# Patient Record
Sex: Female | Born: 1975 | Race: Black or African American | Hispanic: No | Marital: Single | Smoking: Never smoker
Health system: Southern US, Community
[De-identification: ages and names within clinical notes are randomized; demographics above are authoritative.]

## PROBLEM LIST (undated history)

## (undated) DIAGNOSIS — E119 Type 2 diabetes mellitus without complications: Secondary | ICD-10-CM

## (undated) DIAGNOSIS — I10 Essential (primary) hypertension: Secondary | ICD-10-CM

## (undated) HISTORY — PX: BREAST EXCISIONAL BIOPSY: SUR124

## (undated) HISTORY — PX: CHOLECYSTECTOMY: SHX55

## (undated) HISTORY — PX: TONSILLECTOMY: SUR1361

## (undated) HISTORY — PX: OTHER SURGICAL HISTORY: SHX169

## (undated) HISTORY — PX: TUBAL LIGATION: SHX77

---

## 2014-07-09 ENCOUNTER — Encounter (HOSPITAL_COMMUNITY): Payer: Self-pay | Admitting: Emergency Medicine

## 2014-07-09 ENCOUNTER — Emergency Department (HOSPITAL_COMMUNITY)
Admission: EM | Admit: 2014-07-09 | Discharge: 2014-07-10 | Disposition: A | Discharge status: Home / Self Care | Payer: Self-pay | Attending: Emergency Medicine | Admitting: Emergency Medicine

## 2014-07-09 ENCOUNTER — Emergency Department (HOSPITAL_COMMUNITY): Payer: Self-pay

## 2014-07-09 DIAGNOSIS — S20229A Contusion of unspecified back wall of thorax, initial encounter: Secondary | ICD-10-CM | POA: Insufficient documentation

## 2014-07-09 DIAGNOSIS — I10 Essential (primary) hypertension: Secondary | ICD-10-CM | POA: Insufficient documentation

## 2014-07-09 DIAGNOSIS — Y9289 Other specified places as the place of occurrence of the external cause: Secondary | ICD-10-CM | POA: Insufficient documentation

## 2014-07-09 DIAGNOSIS — W108XXA Fall (on) (from) other stairs and steps, initial encounter: Secondary | ICD-10-CM | POA: Insufficient documentation

## 2014-07-09 DIAGNOSIS — S300XXA Contusion of lower back and pelvis, initial encounter: Secondary | ICD-10-CM

## 2014-07-09 DIAGNOSIS — IMO0002 Reserved for concepts with insufficient information to code with codable children: Secondary | ICD-10-CM | POA: Insufficient documentation

## 2014-07-09 DIAGNOSIS — Y9389 Activity, other specified: Secondary | ICD-10-CM | POA: Insufficient documentation

## 2014-07-09 HISTORY — DX: Essential (primary) hypertension: I10

## 2014-07-09 NOTE — ED Notes (Signed)
Patient transported to X-ray 

## 2014-07-09 NOTE — ED Provider Notes (Signed)
CSN: [id number]     Arrival date & time 07/09/14  2235 History  This chart was scribed for non-physician practitioner working with Pamela SkeensJoshua M Zavitz, MD by Pamela Ewing, ED Scribe. This patient was seen in room TR10C/TR10C and the patient's care was started at 11:03 PM.   Chief Complaint  Patient presents with  . Fall  . Tailbone Pain      The history is provided by the patient. No language interpreter was used.   HPI Comments: Pamela DuelStephanie Ewing is a 38 y.o. female who presents to the Emergency Department complaining of tailbone pain, ongoing for one week. Patient reports going down the stairs one whole leg splint and she fell landing on her tailbone. She denies any other injuries. She states her pain is mainly in the tailbone, states at times it radiates up her back. It is sharp. It is worsened with movement and sitting down. She denies pain radiating down her legs. Patient denies numbness, tingling or weakness in her extremities. She denies any pain around her rectum, no blood in her stool. Denies abdominal pain. Has been taking ibuprofen with no relief. States she feels like pain is worsening. History of chronic back pain.  Past Medical History  Diagnosis Date  . Hypertension    Past Surgical History  Procedure Laterality Date  . Breast lumpectomy    . Cholecystectomy    . Tubal ligation    . Tonsillectomy     History reviewed. No pertinent family history. History  Substance Use Topics  . Smoking status: Never Smoker   . Smokeless tobacco: Not on file  . Alcohol Use: No   OB History   Grav Para Term Preterm Abortions TAB SAB Ect Mult Living                 Review of Systems  Constitutional: Negative for fever and chills.  Gastrointestinal: Negative for vomiting, abdominal pain, diarrhea and blood in stool.  Genitourinary: Negative for enuresis.  Musculoskeletal: Positive for arthralgias and back pain.  Skin: Positive for wound.  Neurological: Negative for weakness and  numbness.      Allergies  Azithromycin and Sulfa antibiotics  Home Medications   Prior to Admission medications   Not on File   Triage Vitals: BP 177/97  Pulse 90  Temp(Src) 98.6 F (37 C) (Oral)  Resp 18  SpO2 100%  LMP 07/02/2014 Physical Exam  Nursing note and vitals reviewed. Constitutional: She is oriented to person, place, and time. She appears well-developed and well-nourished. No distress.  HENT:  Head: Normocephalic and atraumatic.  Eyes: EOM are normal.  Neck: Neck supple.  Cardiovascular: Normal rate.   Pulmonary/Chest: Effort normal. No respiratory distress.  Musculoskeletal: Normal range of motion. She exhibits tenderness.  Tenderness over the tip of the tailbone, lumbar and sacral spine nontender. The tenderness over bilateral SI joints. No tenderness over bilateral pelvis. Full range of motion of bilateral legs at the hip joint. DP pulses intact  Neurological: She is alert and oriented to person, place, and time.  Skin: Skin is warm and dry.  Psychiatric: She has a normal mood and affect. Her behavior is normal.    ED Course  Procedures (including critical care time) COORDINATION OF CARE: 11:02 PM- Discussed treatment plan with patient at bedside and patient agreed to plan.   Labs Review Labs Reviewed - No data to display  Imaging Review Dg Sacrum/coccyx  07/09/2014   CLINICAL DATA:  Fall  EXAM: SACRUM AND COCCYX - 2+  VIEW  COMPARISON:  None.  FINDINGS: There is no evidence of fracture or other focal bone lesions  IMPRESSION: Negative.   Electronically Signed   By: Rise Mu M.D.   On: 07/09/2014 23:41     EKG Interpretation None      MDM   Final diagnoses:  Contusion of coccyx, initial encounter    Patient is here after a fall and landing on her buttock one week ago. Here with persistent coccyx pain. X-rays obtained and is negative. Home with ibuprofen, Norco for severe pain, followup with primary care Dr.  Ceasar Ewing Vitals:    07/09/14 2245  BP: 177/97  Pulse: 90  Temp: 98.6 F (37 C)  TempSrc: Oral  Resp: 18  SpO2: 100%     I personally performed the services described in this documentation, which was scribed in my presence. The recorded information has been reviewed and is accurate.    Lottie Mussel, PA-C 07/10/14 [phone]

## 2014-07-09 NOTE — ED Notes (Signed)
One week post fall landed on tailbone, continues to have pain in back and tailbone since fall denies numbness, tingling and loss of B&B.

## 2014-07-10 MED ORDER — HYDROCODONE-ACETAMINOPHEN 5-325 MG PO TABS: 1.0000 | ORAL_TABLET | Freq: Four times a day (QID) | ORAL | Status: DC | PRN

## 2014-07-10 MED ORDER — HYDROCODONE-ACETAMINOPHEN 5-325 MG PO TABS
1.0000 | ORAL_TABLET | Freq: Once | ORAL | Status: AC
  Administered 2014-07-10: 1 via ORAL
  Filled 2014-07-10: qty 1

## 2014-07-10 MED ORDER — IBUPROFEN 800 MG PO TABS: 800.0000 mg | ORAL_TABLET | Freq: Three times a day (TID) | ORAL | Status: DC

## 2014-07-10 NOTE — Discharge Instructions (Signed)
X-ray today did not show any signs of fractures. Take ibuprofen for pain. Norco for severe pain. Get a donut pillow for sitting. Followup with primary care doctor.   Tailbone Injury The tailbone (coccyx) is the small bone at the lower end of the spine. A tailbone injury may involve stretched ligaments, bruising, or a broken bone (fracture). Women are more vulnerable to this injury due to having a wider pelvis. CAUSES  This type of injury typically occurs from falling and landing on the tailbone. Repeated strain or friction from actions such as rowing and bicycling may also injure the area. The tailbone can be injured during childbirth. Infections or tumors may also press on the tailbone and cause pain. Sometimes, the cause of injury is unknown. SYMPTOMS   Bruising.  Pain when sitting.  Painful bowel movements.  In women, pain during intercourse. DIAGNOSIS  Your caregiver can diagnose a tailbone injury based on your symptoms and a physical exam. X-rays may be taken if a fracture is suspected. Your caregiver may also use an MRI scan imaging test to evaluate your symptoms. TREATMENT  Your caregiver may prescribe medicines to help relieve your pain. Most tailbone injuries heal on their own in 4 to 6 weeks. However, if the injury is caused by an infection or tumor, the recovery period may vary. PREVENTION  Wear appropriate padding and sports gear when bicycling and rowing. This can help prevent an injury from repeated strain or friction. HOME CARE INSTRUCTIONS   Put ice on the injured area.  Put ice in a plastic bag.  Place a towel between your skin and the bag.  Leave the ice on for 15-20 minutes, every hour while awake for the first 1 to 2 days.  Sit on a large, rubber or inflated ring or cushion to ease your pain. Lean forward when sitting to help decrease discomfort.  Avoid sitting for long periods of time.  Increase your activity as the pain allows.  Only take over-the-counter  or prescription medicines for pain, discomfort, or fever as directed by your caregiver.  You may use stool softeners if it is painful to have a bowel movement, or as directed by your caregiver.  Eat a diet with plenty of fiber to help prevent constipation.  Keep all follow-up appointments as directed by your caregiver. SEEK MEDICAL CARE IF:   Your pain becomes worse.  Your bowel movements cause a great deal of discomfort.  You are unable to have a bowel movement.  You have a fever. MAKE SURE YOU:  Understand these instructions.  Will watch your condition.  Will get help right away if you are not doing well or get worse. Document Released: 11/29/2000 Document Revised: 02/24/2012 Document Reviewed: 06/27/2011 [facility] Patient Information 2015 [place], [place]. This information is not intended to replace advice given to you by your health care provider. Make sure you discuss any questions you have with your health care provider.

## 2014-07-10 NOTE — ED Provider Notes (Signed)
Medical screening examination/treatment/procedure(s) were performed by non-physician practitioner and as supervising physician I was immediately available for consultation/collaboration.   EKG Interpretation None        Greysin Medlen M Larson Limones, MD 07/10/14 0812 

## 2014-07-10 NOTE — ED Notes (Signed)
Discharge instructions and prescription given  Voiced understanding 

## 2014-07-18 ENCOUNTER — Encounter (HOSPITAL_COMMUNITY): Payer: Self-pay | Admitting: Emergency Medicine

## 2014-07-18 ENCOUNTER — Emergency Department (HOSPITAL_COMMUNITY)
Admission: EM | Admit: 2014-07-18 | Discharge: 2014-07-18 | Disposition: A | Discharge status: Home / Self Care | Payer: Self-pay | Attending: Emergency Medicine | Admitting: Emergency Medicine

## 2014-07-18 DIAGNOSIS — I10 Essential (primary) hypertension: Secondary | ICD-10-CM | POA: Insufficient documentation

## 2014-07-18 DIAGNOSIS — H9209 Otalgia, unspecified ear: Secondary | ICD-10-CM | POA: Insufficient documentation

## 2014-07-18 DIAGNOSIS — R599 Enlarged lymph nodes, unspecified: Secondary | ICD-10-CM | POA: Insufficient documentation

## 2014-07-18 DIAGNOSIS — Z79899 Other long term (current) drug therapy: Secondary | ICD-10-CM | POA: Insufficient documentation

## 2014-07-18 DIAGNOSIS — R590 Localized enlarged lymph nodes: Secondary | ICD-10-CM

## 2014-07-18 DIAGNOSIS — R209 Unspecified disturbances of skin sensation: Secondary | ICD-10-CM | POA: Insufficient documentation

## 2014-07-18 MED ORDER — AMOXICILLIN 500 MG PO CAPS: 500.0000 mg | ORAL_CAPSULE | Freq: Three times a day (TID) | ORAL | Status: DC

## 2014-07-18 NOTE — ED Notes (Signed)
Pt. Stated, I have this lymph node that is swollen behind my left ear causing my ear to be numb, tingling, and my face feels numb.

## 2014-07-18 NOTE — ED Notes (Signed)
Declined W/C at D/C and was escorted to lobby by RN. 

## 2014-07-18 NOTE — Discharge Instructions (Signed)
Take the prescribed medication as directed. Follow-up with a primary care physician in the area to continue monitoring symptoms/lymphadenopathy.  Resource guide attached to help with this. Return to the ED for new or worsening symptoms.   Emergency Department Resource Guide 1) Find a Doctor and Pay Out of Pocket Although you won't have to find out who is covered by your insurance plan, it is a good idea to ask around and get recommendations. You will then need to call the office and see if the doctor you have chosen will accept you as a new patient and what types of options they offer for patients who are self-pay. Some doctors offer discounts or will set up payment plans for their patients who do not have insurance, but you will need to ask so you aren't surprised when you get to your appointment.  2) Contact Your Local Health Department Not all health departments have doctors that can see patients for sick visits, but many do, so it is worth a call to see if yours does. If you don't know where your local health department is, you can check in your phone book. The CDC also has a tool to help you locate your state's health department, and many state websites also have listings of all of their local health departments.  3) Find a Walk-in Clinic If your illness is not likely to be very severe or complicated, you may want to try a walk in clinic. These are popping up all over the country in pharmacies, drugstores, and shopping centers. They're usually staffed by nurse practitioners or physician assistants that have been trained to treat common illnesses and complaints. They're usually fairly quick and inexpensive. However, if you have serious medical issues or chronic medical problems, these are probably not your best option.  No Primary Care Doctor: - Call Health Connect at  [phone] - they can help you locate a primary care doctor that  accepts your insurance, provides certain services,  etc. - Physician Referral Service- [phone]  Chronic Pain Problems: Organization         Address  Phone   Notes  [facility]  [phone] Patients need to be referred by their primary care doctor.   Medication Assistance: Organization         Address  Phone   Notes  [facility] Medication [facility] [address] [place], [place] [address] [phone] --Must be a resident of [facility] -- Must have NO insurance coverage whatsoever (no Medicaid/ Medicare, etc.) -- The pt. MUST have a primary care doctor that directs their care regularly and follows them in the community   Med[facility]ist  [phone]   [employer]  [phone]    Agencies that provide inexpensive medical care: Organization         Address  Phone   Notes  [facility]  [phone]   [facility]    [phone]   [facility] [address] [place], [place] [address] [phone]   [facility] [address] [place], [place] [phone]   Planned Parenthood    [phone]   [facility]    [phone]   [facility]  [address], [place] Phone:  [phone], Fax:  [phone] Hours of Operation:  9 am - 6 pm, M-F.  Also accepts Medicaid/Medicare and self-pay.  [facility]  [address], [place] Phone: [phone], Fax: [phone]. Hours of Operation:  8:30 am - 5:30 pm, M-F.  Also accepts Medicaid and self-pay.  [facility] [place] [address], [place] Phone: [phone]) [phone]   [facility] [address], [place], [place] [phone], Ext. [phone] Mondays & Thursdays: 7-9 AM.  First 15 patients are seen on a first come, first serve basis.    Medicaid-accepting [facility] Providers:  Organization         Address  Phone   Notes  [facility] [address], [place] [phone]) [phone] Also accepts self-pay patients.  [facility] [address], [place]  [phone]   [facility] [address], [place] [phone]   [facility] [address] [place] [address], [place] [phone]) [phone]   Renaye RakersVeita Bland [address], [place]   [phone] Only accepts [place] Access [place]icaid patients after they have their name applied to their card.   Self-Pay (no insurance) in [facility]:  Organization         Address  Phone   Notes  Sickle Cell Patients, [facility] [address], [place] [phone]) [phone]   [facility] [address], [place] [phone]   [facility] [place]  [address], [place] [phone]) [phone]   [facility]/Dr. Osei-Bonsu  [address] [place] [address], [place] or [phone], [place] [phone]) [phone] Phone number for both [place] and [place] locations is the same.  [facility] [address], [place] [phone]) [phone]   [facility] [address] [place] [address], [place] or [address] [phone](336) [phone]) [phone]   [facility] [address], [place] [phone]) [phone], phone; [phone], fax Sees patients 1st and 3rd Saturday of every month.  Must not qualify for public or private insurance (i.e. Medicaid, Medicare, [place] Health Choice, Veterans' Benefits)  Household income should be no more than 200% of the poverty level The clinic cannot treat you if you are pregnant or think you are pregnant  Sexually transmitted diseases are not treated at the clinic.    Dental Care: Organization         Address  Phone  Notes  [facility] of [facility] [address], [place] [phone] Accepts children up to  age 37 who are enrolled in [place]icaid or [place] Health Choice; pregnant women with a Medicaid card; and children who have applied for Medicaid or [place] Health Choice, but were declined, whose parents can pay a reduced fee at time of service.  [facility] of [facility] [place]  [address], [place] [phone] Accepts children up to age 37 who are enrolled in [place]icaid or [place] Health Choice; pregnant women with a Medicaid card; and children who have applied for Medicaid or [place] Health Choice, but were declined, whose parents can pay a reduced fee at time of service.  [facility]  [address], [place] [phone] Patients are seen by appointment only. Walk-ins are not accepted. [facility] will see patients 37 years of age and older. Monday - Tuesday (8am-5pm) Most Wednesdays (8:30-5pm) $30 per visit, cash  only  [facility]  [address], [place] [phone]) [phone] Patients are seen by appointment only. Walk-ins are not accepted. [facility] will see patients 37 years of age and older. One Wednesday Evening (Monthly: Volunteer Based).  $30 per visit, cash only  [facility]  [phone] for adults; Children under age 494, call Graduate Pediatric Dentistry at [phone]. Children aged 524-14, please call [phone] to request a pediatric application.  Dental services are provided in all areas of dental care including fillings, crowns and bridges, complete and partial dentures, implants, gum treatment, root canals, and extractions. Preventive care is also provided. Treatment is provided to both adults and children. Patients are selected via a lottery and there is often a waiting list.   [facility] [address], [place]  [phone] [email or web address]   [facility] [address], [place], [place] [phone], Ext. [phone] Second and Fourth Thursday of  each month, opens at 6:30 AM; Clinic ends at 9 AM.  Patients are seen on a first-come first-served basis, and a limited number are seen during each clinic.   [facility]  [address], [place], [place] [phone](336) [phone]   Eligibility Requirements You must have lived in [place], [place], or [place] counties for at least the last three months.   You cannot be eligible for state or federal sponsored National Cityhealthcare insurance, including CIGNAVeterans Administration, [place]icaid, or Har[employer] EntertainmentMedicare.   You generally cannot be eligible for healthcare insurance through your employer.    How to apply: Eligibility screenings are held every Tuesday and Wednesday afternoon from 1:00 pm until 4:00 pm. You do not need an appointment for the interview!  [facility] [address], [place], [place] [phone]   [facility]  [phone]   [facility]  [phone]   [facility]  [phone]    Behavioral Health Resources in the Community: Intensive Outpatient Programs Organization         Address  Phone  Notes  [facility] [address], [place], [place] [phone]   [facility] [address], [place], [place] [phone]   [facility] [address], [place], [place]  [phone]   [facility] [address],  [place], [place] [phone] or [phone]   Substance Abuse Resources Organization         Address  Phone  Notes  [facility] Drug Services  [phone]   [facility]  [phone]   [facility]  [phone]   [facility]  [phone]   Residential & Outpatient Substance Abuse Program  [phone]   Psychological Services Organization         Address  Phone  Notes  [facility]  [phone]- [phone]   [facility]  [phone]- [phone]   [facility] [address],  [place] [phone] or [phone]    Mobile Crisis Teams Organization         Address  Phone  Notes  [facility], Mobile Crisis Care Unit  [phone]   [facility]  [address] [place], [place] [phone]   Doristine LocksSharon DeEsch [address] [place] [phone]    Self-Help/Support Groups Organization         Address  Phone  Notes  [facility] [place] - variety of support groups  [place] Call for more information  Narcotics Anonymous (NA), Caring Services [address], [employer] [place]  2 meetings at this location   Special educational needs teacher         Address  Phone  Notes  ASAP Residential Treatment [place],    [place]  [phone]   [facility]  [address], [place] [phone], [place], [place]   [place], [place] [phone] Admissions: 8am-3pm M-F  Incentives Substance [place] [address],    [place], [place] [phone]   [facility] [address] [place], [place], [place]   [facility] [address]  [place], [place]   [facility] Programs - Intensive Outpatient [address], [place], [place]   [facility] ([place].,  [place], [place] [phone] or [phone]   [facility] ([facility]) [address] [place], [place] Accepts Medicaid  [facility] [address],  [place] [phone] Substance Abuse/Addiction Treatment   [facility] Organization         Address  Phone  Notes  [facility]  [phone]   Domenic Schwab, PhD [address] [place], [place]   [phone] or [phone]   [place]sey [place] [address] [place], [place] [phone]     [facility] [address], [place], [place] [phone] Insurance/Medicaid/sponsorship through [facility] and Families [address], [address]                                    [place], [place] [phone] [place] Road[address], [place] [phone]    Dr. Adele Schilder  [phone]   [facility] Dept. 1) [address], [place] 2) [place] 3)  [address], [place] [phone]  [phone]   [place] [phone] or [phone] (After Hours)

## 2014-07-18 NOTE — ED Provider Notes (Signed)
CSN: [id number]     Arrival date & time 07/18/14  1517 History  This chart was scribed for non-physician practitioner, Sharilyn Sites, PA-C, working with Doug Sou, MD by Charline Bills, ED Scribe. This patient was seen in room TR06C/TR06C and the patient's care was started at 5:40 PM.   Chief Complaint  Patient presents with  . Otalgia  . Adenopathy   The history is provided by the patient. No language interpreter was used.   HPI Comments: Pamela Ewing is a 38 y.o. female who presents to the Emergency Department complaining of swollen lymph node behind her L ear with an associated throbbing sensation. Pt has h/o enlarged lymph nodes, states she has been seeing her PCP in [place] for this but they cannot figure out what is causing it.  She denies fever, ear pain, sore throat, difficulty swallowing, hearing loss. No PCP-she just moved here from [place]  Past Medical History  Diagnosis Date  . Hypertension    Past Surgical History  Procedure Laterality Date  . Breast lumpectomy    . Cholecystectomy    . Tubal ligation    . Tonsillectomy     No family history on file. History  Substance Use Topics  . Smoking status: Never Smoker   . Smokeless tobacco: Not on file  . Alcohol Use: No   OB History   Grav Para Term Preterm Abortions TAB SAB Ect Mult Living                 Review of Systems  HENT: Negative for dental problem, ear pain, sore throat and trouble swallowing.   Neurological: Positive for numbness.  All other systems reviewed and are negative.  Allergies  Azithromycin and Sulfa antibiotics  Home Medications   Prior to Admission medications   Medication Sig Start Date End Date Taking? Authorizing Provider  hydrochlorothiazide (HYDRODIURIL) 25 MG tablet Take 25 mg by mouth at bedtime.   Yes Historical Provider, MD  HYDROcodone-acetaminophen (NORCO/VICODIN) 5-325 MG per tablet Take 1 tablet by mouth every 6 (six) hours as needed for moderate pain. 07/10/14  Yes  Tatyana A Kirichenko, PA-C  ibuprofen (ADVIL,MOTRIN) 800 MG tablet Take 800 mg by mouth every 6 (six) hours as needed for moderate pain. 07/10/14  Yes Tatyana A Kirichenko, PA-C   Triage Vitals: BP 165/98  Pulse 98  Temp(Src) 98 F (36.7 C) (Oral)  SpO2 98%  LMP 07/02/2014 Physical Exam  Nursing note and vitals reviewed. Constitutional: She is oriented to person, place, and time. She appears well-developed and well-nourished. No distress.  HENT:  Head: Normocephalic and atraumatic.  Right Ear: Tympanic membrane and ear canal normal.  Left Ear: Tympanic membrane and ear canal normal.  Mouth/Throat: Oropharynx is clear and moist.  Mastoids nontender  Eyes: Conjunctivae and EOM are normal. Pupils are equal, round, and reactive to light.  Neck: Normal range of motion and full passive range of motion without pain. Neck supple. No rigidity.  Cardiovascular: Normal rate, regular rhythm and normal heart sounds.   Pulmonary/Chest: Effort normal and breath sounds normal. No respiratory distress. She has no wheezes.  Musculoskeletal: Normal range of motion.  Lymphadenopathy:       Head (left side): Posterior auricular adenopathy present.  Left posterior auricular lymphadenopathy; locally TTP; no overlying erythema, warmth to touch, or signs of infection  Neurological: She is alert and oriented to person, place, and time.  Skin: Skin is warm and dry. She is not diaphoretic.  Psychiatric: She has a normal mood  and affect.   ED Course  Procedures (including critical care time) DIAGNOSTIC STUDIES: Oxygen Saturation is 98% on RA, normal by my interpretation.    COORDINATION OF CARE: 5:44 PM-Discussed treatment plan which includes antibiotics with pt at bedside and pt agreed to plan.   Labs Review Labs Reviewed - No data to display  Imaging Review No results found.   EKG Interpretation None      MDM   Final diagnoses:  Posterior auricular lymphadenopathy   Left posterior auricular  lymphadenopathy without known cause.  Pt is afebrile and overall non-toxic appearing.  Will start on amoxicillin.  Encouraged close FU with PCP in the area-- resource guide given to help with this.  Discussed plan with patient, he/she acknowledged understanding and agreed with plan of care.  Return precautions given for new or worsening symptoms.  I personally performed the services described in this documentation, which was scribed in my presence. The recorded information has been reviewed and is accurate.  Garlon HatchetLisa M Cola Highfill, PA-C 07/18/14 [phone]

## 2014-07-19 NOTE — ED Provider Notes (Signed)
Medical screening examination/treatment/procedure(s) were performed by non-physician practitioner and as supervising physician I was immediately available for consultation/collaboration.   EKG Interpretation None       Doug SouSam Ameyah Bangura, MD 07/19/14 [phone]

## 2014-11-29 ENCOUNTER — Encounter (HOSPITAL_COMMUNITY): Payer: Self-pay | Admitting: Physical Medicine and Rehabilitation

## 2014-11-29 ENCOUNTER — Emergency Department (HOSPITAL_COMMUNITY)
Admission: EM | Admit: 2014-11-29 | Discharge: 2014-11-29 | Disposition: A | Discharge status: Home / Self Care | Payer: 59 | Attending: Emergency Medicine | Admitting: Emergency Medicine

## 2014-11-29 ENCOUNTER — Emergency Department (HOSPITAL_COMMUNITY): Payer: 59

## 2014-11-29 DIAGNOSIS — Z792 Long term (current) use of antibiotics: Secondary | ICD-10-CM | POA: Diagnosis not present

## 2014-11-29 DIAGNOSIS — Z3202 Encounter for pregnancy test, result negative: Secondary | ICD-10-CM | POA: Insufficient documentation

## 2014-11-29 DIAGNOSIS — G44219 Episodic tension-type headache, not intractable: Secondary | ICD-10-CM | POA: Insufficient documentation

## 2014-11-29 DIAGNOSIS — G44209 Tension-type headache, unspecified, not intractable: Secondary | ICD-10-CM

## 2014-11-29 DIAGNOSIS — M7989 Other specified soft tissue disorders: Secondary | ICD-10-CM

## 2014-11-29 DIAGNOSIS — I10 Essential (primary) hypertension: Secondary | ICD-10-CM | POA: Insufficient documentation

## 2014-11-29 LAB — CBC WITH DIFFERENTIAL/PLATELET
Basophils Absolute: 0 10*3/uL (ref 0.0–0.1)
Basophils Relative: 0 % (ref 0–1)
EOS ABS: 0.1 10*3/uL (ref 0.0–0.7)
Eosinophils Relative: 1 % (ref 0–5)
HCT: 35.2 % — ABNORMAL LOW (ref 36.0–46.0)
Hemoglobin: 11.4 g/dL — ABNORMAL LOW (ref 12.0–15.0)
Lymphocytes Relative: 39 % (ref 12–46)
Lymphs Abs: 3.3 10*3/uL (ref 0.7–4.0)
MCH: 26.4 pg (ref 26.0–34.0)
MCHC: 32.4 g/dL (ref 30.0–36.0)
MCV: 81.5 fL (ref 78.0–100.0)
Monocytes Absolute: 0.6 10*3/uL (ref 0.1–1.0)
Monocytes Relative: 7 % (ref 3–12)
Neutro Abs: 4.4 10*3/uL (ref 1.7–7.7)
Neutrophils Relative %: 53 % (ref 43–77)
PLATELETS: 318 10*3/uL (ref 150–400)
RBC: 4.32 MIL/uL (ref 3.87–5.11)
RDW: 15.9 % — ABNORMAL HIGH (ref 11.5–15.5)
WBC: 8.5 10*3/uL (ref 4.0–10.5)

## 2014-11-29 LAB — BASIC METABOLIC PANEL
Anion gap: 12 (ref 5–15)
BUN: 16 mg/dL (ref 6–23)
CHLORIDE: 102 meq/L (ref 96–112)
CO2: 22 mEq/L (ref 19–32)
Calcium: 9.2 mg/dL (ref 8.4–10.5)
Creatinine, Ser: 0.8 mg/dL (ref 0.50–1.10)
GLUCOSE: 84 mg/dL (ref 70–99)
POTASSIUM: 4.5 meq/L (ref 3.7–5.3)
SODIUM: 136 meq/L — AB (ref 137–147)

## 2014-11-29 LAB — POC URINE PREG, ED: PREG TEST UR: NEGATIVE

## 2014-11-29 MED ORDER — ONDANSETRON HCL 4 MG/2ML IJ SOLN
4.0000 mg | Freq: Once | INTRAMUSCULAR | Status: AC
  Administered 2014-11-29: 4 mg via INTRAVENOUS
  Filled 2014-11-29: qty 2

## 2014-11-29 MED ORDER — KETOROLAC TROMETHAMINE 30 MG/ML IJ SOLN
30.0000 mg | Freq: Once | INTRAMUSCULAR | Status: AC
  Administered 2014-11-29: 30 mg via INTRAVENOUS
  Filled 2014-11-29: qty 1

## 2014-11-29 MED ORDER — FUROSEMIDE 20 MG PO TABS
20.0000 mg | ORAL_TABLET | Freq: Once | ORAL | Status: AC
  Administered 2014-11-29: 20 mg via ORAL

## 2014-11-29 MED ORDER — DIPHENHYDRAMINE HCL 50 MG/ML IJ SOLN
25.0000 mg | Freq: Once | INTRAMUSCULAR | Status: AC
  Administered 2014-11-29: 25 mg via INTRAVENOUS
  Filled 2014-11-29: qty 1

## 2014-11-29 MED ORDER — SODIUM CHLORIDE 0.9 % IV BOLUS (SEPSIS)
1000.0000 mL | Freq: Once | INTRAVENOUS | Status: AC
  Administered 2014-11-29: 1000 mL via INTRAVENOUS

## 2014-11-29 MED ORDER — HYDROCHLOROTHIAZIDE 25 MG PO TABS: 25.0000 mg | ORAL_TABLET | Freq: Every day | ORAL | Status: DC

## 2014-11-29 MED ORDER — HYDROCHLOROTHIAZIDE 25 MG PO TABS
25.0000 mg | ORAL_TABLET | Freq: Every day | ORAL | Status: DC
  Administered 2014-11-29: 25 mg via ORAL

## 2014-11-29 MED ORDER — HYDROCODONE-ACETAMINOPHEN 5-325 MG PO TABS: 1.0000 | ORAL_TABLET | Freq: Four times a day (QID) | ORAL | Status: DC | PRN

## 2014-11-29 MED ORDER — HYDROCHLOROTHIAZIDE 25 MG PO TABS
25.0000 mg | ORAL_TABLET | Freq: Every day | ORAL | Status: DC
  Filled 2014-11-29: qty 1

## 2014-11-29 MED ORDER — FUROSEMIDE 20 MG PO TABS
10.0000 mg | ORAL_TABLET | Freq: Once | ORAL | Status: DC
  Filled 2014-11-29: qty 1

## 2014-11-29 NOTE — ED Notes (Signed)
Pt presents to department for evaluation of hypertension and headache. States she ran out of HTN medications x2 weeks ago. 8/10 headache upon arrival. No signs of distress noted. Pt is alert and oriented x4.

## 2014-11-29 NOTE — ED Notes (Signed)
Family at bedside. 

## 2014-11-29 NOTE — ED Notes (Signed)
Pt has "restricted extremity" bracelet on left arm.

## 2014-11-29 NOTE — Discharge Instructions (Signed)
Hypertension °Hypertension, commonly called high blood pressure, is when the force of blood pumping through your arteries is too strong. Your arteries are the blood vessels that carry blood from your heart throughout your body. A blood pressure reading consists of a higher number over a lower number, such as 110/72. The higher number (systolic) is the pressure inside your arteries when your heart pumps. The lower number (diastolic) is the pressure inside your arteries when your heart relaxes. Ideally you want your blood pressure below 120/80. °Hypertension forces your heart to work harder to pump blood. Your arteries may become narrow or stiff. Having hypertension puts you at risk for heart disease, stroke, and other problems.  °RISK FACTORS °Some risk factors for high blood pressure are controllable. Others are not.  °Risk factors you cannot control include:  °· Race. You may be at higher risk if you are African American. °· Age. Risk increases with age. °· Gender. Men are at higher risk than women before age 45 years. After age 65, women are at higher risk than men. °Risk factors you can control include: °· Not getting enough exercise or physical activity. °· Being overweight. °· Getting too much fat, sugar, calories, or salt in your diet. °· Drinking too much alcohol. °SIGNS AND SYMPTOMS °Hypertension does not usually cause signs or symptoms. Extremely high blood pressure (hypertensive crisis) may cause headache, anxiety, shortness of breath, and nosebleed. °DIAGNOSIS  °To check if you have hypertension, your health care provider will measure your blood pressure while you are seated, with your arm held at the level of your heart. It should be measured at least twice using the same arm. Certain conditions can cause a difference in blood pressure between your right and left arms. A blood pressure reading that is higher than normal on one occasion does not mean that you need treatment. If one blood pressure reading  is high, ask your health care provider about having it checked again. °TREATMENT  °Treating high blood pressure includes making lifestyle changes and possibly taking medicine. Living a healthy lifestyle can help lower high blood pressure. You may need to change some of your habits. °Lifestyle changes may include: °· Following the DASH diet. This diet is high in fruits, vegetables, and whole grains. It is low in salt, red meat, and added sugars. °· Getting at least 2½ hours of brisk physical activity every week. °· Losing weight if necessary. °· Not smoking. °· Limiting alcoholic beverages. °· Learning ways to reduce stress. ° If lifestyle changes are not enough to get your blood pressure under control, your health care provider may prescribe medicine. You may need to take more than one. Work closely with your health care provider to understand the risks and benefits. °HOME CARE INSTRUCTIONS °· Have your blood pressure rechecked as directed by your health care provider.   °· Take medicines only as directed by your health care provider. Follow the directions carefully. Blood pressure medicines must be taken as prescribed. The medicine does not work as well when you skip doses. Skipping doses also puts you at risk for problems.   °· Do not smoke.   °· Monitor your blood pressure at home as directed by your health care provider.  °SEEK MEDICAL CARE IF:  °· You think you are having a reaction to medicines taken. °· You have recurrent headaches or feel dizzy. °· You have swelling in your ankles. °· You have trouble with your vision. °SEEK IMMEDIATE MEDICAL CARE IF: °· You develop a severe headache or confusion. °·   You have unusual weakness, numbness, or feel faint.  You have severe chest or abdominal pain.  You vomit repeatedly.  You have trouble breathing. MAKE SURE YOU:   Understand these instructions.  Will watch your condition.  Will get help right away if you are not doing well or get worse. Document  Released: 12/02/2005 Document Revised: 04/18/2014 Document Reviewed: 09/24/2013 [facility] Patient Information 2015 [place], [place]. This information is not intended to replace advice given to you by your health care provider. Make sure you discuss any questions you have with your health care provider.  Peripheral Edema You have swelling in your legs (peripheral edema). This swelling is due to excess accumulation of salt and water in your body. Edema may be a sign of heart, kidney or liver disease, or a side effect of a medication. It may also be due to problems in the leg veins. Elevating your legs and using special support stockings may be very helpful, if the cause of the swelling is due to poor venous circulation. Avoid long periods of standing, whatever the cause. Treatment of edema depends on identifying the cause. Chips, pretzels, pickles and other salty foods should be avoided. Restricting salt in your diet is almost always needed. Water pills (diuretics) are often used to remove the excess salt and water from your body via urine. These medicines prevent the kidney from reabsorbing sodium. This increases urine flow. Diuretic treatment may also result in lowering of potassium levels in your body. Potassium supplements may be needed if you have to use diuretics daily. Daily weights can help you keep track of your progress in clearing your edema. You should call your caregiver for follow up care as recommended. SEEK IMMEDIATE MEDICAL CARE IF:   You have increased swelling, pain, redness, or heat in your legs.  You develop shortness of breath, especially when lying down.  You develop chest or abdominal pain, weakness, or fainting.  You have a fever. Document Released: 01/09/2005 Document Revised: 02/24/2012 Document Reviewed: 12/20/2009 [facility] Patient Information 2015 [place], [place]. This information is not intended to replace advice given to you by your health care provider. Make sure you  discuss any questions you have with your health care provider.  General Headache Without Cause A general headache is pain or discomfort felt around the head or neck area. The cause may not be found.  HOME CARE   Keep all doctor visits.  Only take medicines as told by your doctor.  Lie down in a dark, quiet room when you have a headache.  Keep a journal to find out if certain things bring on headaches. For example, write down:  What you eat and drink.  How much sleep you get.  Any change to your diet or medicines.  Relax by getting a massage or doing other relaxing activities.  Put ice or heat packs on the head and neck area as told by your doctor.  Lessen stress.  Sit up straight. Do not tighten (tense) your muscles.  Quit smoking if you smoke.  Lessen how much alcohol you drink.  Lessen how much caffeine you drink, or stop drinking caffeine.  Eat and sleep on a regular schedule.  Get 7 to 9 hours of sleep, or as told by your doctor.  Keep lights dim if bright lights bother you or make your headaches worse. GET HELP RIGHT AWAY IF:   Your headache becomes really bad.  You have a fever.  You have a stiff neck.  You have trouble  seeing.  Your muscles are weak, or you lose muscle control.  You lose your balance or have trouble walking.  You feel like you will pass out (faint), or you pass out.  You have really bad symptoms that are different than your first symptoms.  You have problems with the medicines given to you by your doctor.  Your medicines do not work.  Your headache feels different than the other headaches.  You feel sick to your stomach (nauseous) or throw up (vomit). MAKE SURE YOU:   Understand these instructions.  Will watch your condition.  Will get help right away if you are not doing well or get worse. Document Released: 09/10/2008 Document Revised: 02/24/2012 Document Reviewed: 11/22/2011 [facility] Patient Information 2015  [place], [place]. This information is not intended to replace advice given to you by your health care provider. Make sure you discuss any questions you have with your health care provider.  Results for orders placed or performed during the hospital encounter of 11/29/14  CBC with Differential  Result Value Ref Range   WBC 8.5 4.0 - 10.5 K/uL   RBC 4.32 3.87 - 5.11 MIL/uL   Hemoglobin 11.4 (L) 12.0 - 15.0 g/dL   HCT 16.135.2 (L) 09.636.0 - 04.546.0 %   MCV 81.5 78.0 - 100.0 fL   MCH 26.4 26.0 - 34.0 pg   MCHC 32.4 30.0 - 36.0 g/dL   RDW 40.915.9 (H) 81.111.5 - 91.415.5 %   Platelets 318 150 - 400 K/uL   Neutrophils Relative % 53 43 - 77 %   Neutro Abs 4.4 1.7 - 7.7 K/uL   Lymphocytes Relative 39 12 - 46 %   Lymphs Abs 3.3 0.7 - 4.0 K/uL   Monocytes Relative 7 3 - 12 %   Monocytes Absolute 0.6 0.1 - 1.0 K/uL   Eosinophils Relative 1 0 - 5 %   Eosinophils Absolute 0.1 0.0 - 0.7 K/uL   Basophils Relative 0 0 - 1 %   Basophils Absolute 0.0 0.0 - 0.1 K/uL  Basic metabolic panel  Result Value Ref Range   Sodium 136 (L) 137 - 147 mEq/L   Potassium 4.5 3.7 - 5.3 mEq/L   Chloride 102 96 - 112 mEq/L   CO2 22 19 - 32 mEq/L   Glucose, Bld 84 70 - 99 mg/dL   BUN 16 6 - 23 mg/dL   Creatinine, Ser 7.820.80 0.50 - 1.10 mg/dL   Calcium 9.2 8.4 - 95.610.5 mg/dL   GFR calc non Af Amer >90 >90 mL/min   GFR calc Af Amer >90 >90 mL/min   Anion gap 12 5 - 15  P[facility] urine preg[facility])  Result Value Ref Range   Preg Test, Ur NEGATIVE NEGATIVE   Dg Chest 2 View  11/29/2014   CLINICAL DATA:  Headache, hypertension and dizziness beginning last night.  EXAM: CHEST  2 VIEW  COMPARISON:  None.  FINDINGS: The lungs are clear. Heart size is normal. There is no pneumothorax or pleural effusion. No focal bony abnormality is identified.  IMPRESSION: Negative chest.   Electronically Signed   By: Drusilla Kannerhomas  Dalessio M.D.   On: 11/29/2014 13:11

## 2014-11-29 NOTE — ED Notes (Signed)
Pt allowed to eat per Pa

## 2014-11-29 NOTE — ED Provider Notes (Signed)
CSN: [id number]     Arrival date & time 11/29/14  1033 History   First MD Initiated Contact with Patient 11/29/14 1104     Chief Complaint  Patient presents with  . Hypertension  . Headache     (Consider location/radiation/quality/duration/timing/severity/associated sxs/prior Treatment) HPI   Patient has newly relocated to the area, she works for [employer] and has PMH of hypertension and headaches. She reports taking hydrochlorothiazide for her fluid accumulation and blood pressure. She has been out of this medication for two weeks because she doesn't have a PCP in the area yet. [facility] and [facility] do not have open appointments until January. She is now having lower extremity swelling and frontal headaches that are pressure and do not radiate. She reports her BP has staying around 150/100. This is high for her and she is concerned it may be the cause of her headaches.   Past Medical History  Diagnosis Date  . Hypertension    Past Surgical History  Procedure Laterality Date  . Breast lumpectomy    . Cholecystectomy    . Tubal ligation    . Tonsillectomy     No family history on file. History  Substance Use Topics  . Smoking status: Never Smoker   . Smokeless tobacco: Not on file  . Alcohol Use: No   OB History    No data available     Review of Systems  10 Systems reviewed and are negative for acute change except as noted in the HPI.   Allergies  Azithromycin; Lisinopril; and Sulfa antibiotics  Home Medications   Prior to Admission medications   Medication Sig Start Date End Date Taking? Authorizing Provider  amoxicillin (AMOXIL) 500 MG capsule Take 1 capsule (500 mg total) by mouth 3 (three) times daily. 07/18/14  Yes Garlon HatchetLisa M Sanders, PA-C  ibuprofen (ADVIL,MOTRIN) 800 MG tablet Take 800 mg by mouth every 6 (six) hours as needed for moderate pain. 07/10/14  Yes Tatyana A Kirichenko, PA-C  hydrochlorothiazide (HYDRODIURIL) 25  MG tablet Take 1 tablet (25 mg total) by mouth at bedtime. 11/29/14   Dorthula Matasiffany G Ranay Ketter, PA-C  HYDROcodone-acetaminophen (NORCO/VICODIN) 5-325 MG per tablet Take 1-2 tablets by mouth every 6 (six) hours as needed for moderate pain. 11/29/14   Bowen Kia Irine SealG Raygan Skarda, PA-C   BP 143/88 mmHg  Pulse 76  Temp(Src) 97.9 F (36.6 C) (Oral)  Resp 18  Ht 5\' 4"  (1.626 m)  Wt 248 lb (112.492 kg)  BMI 42.55 kg/m2  SpO2 100% Physical Exam  Constitutional: She appears well-developed and well-nourished. No distress.  HENT:  Head: Normocephalic and atraumatic.  Eyes: Pupils are equal, round, and reactive to light.  Neck: Normal range of motion. Neck supple.  Cardiovascular: Normal rate and regular rhythm.   Pulmonary/Chest: Effort normal.  Abdominal: Soft.  Musculoskeletal:  Bilateral lower extremity swelling 1+ pitting edema  Neurological: She is alert.  Cranial nerves II-VIII and X-XII evaluated and show no deficits. Pt alert and oriented x 3 Upper and lower extremity strength is symmetrical and physiologic Normal muscular tone No facial droop Coordination intact, no limb ataxia, finger-nose-finger normal Rapid alternating movements normal No pronator drift  Skin: Skin is warm and dry.  Nursing note and vitals reviewed.   ED Course  Procedures (including critical care time) Labs Review Labs Reviewed  CBC WITH DIFFERENTIAL - Abnormal; Notable for the following:    Hemoglobin 11.4 (*)    HCT 35.2 (*)  RDW 15.9 (*)    All other components within normal limits  BASIC METABOLIC PANEL - Abnormal; Notable for the following:    Sodium 136 (*)    All other components within normal limits  POC URINE PREG, ED    Imaging Review Dg Chest 2 View  11/29/2014   CLINICAL DATA:  Headache, hypertension and dizziness beginning last night.  EXAM: CHEST  2 VIEW  COMPARISON:  None.  FINDINGS: The lungs are clear. Heart size is normal. There is no pneumothorax or pleural effusion. No focal bony abnormality  is identified.  IMPRESSION: Negative chest.   Electronically Signed   By: Drusilla Kannerhomas  Dalessio M.D.   On: 11/29/2014 13:11     EKG Interpretation None      MDM   Final diagnoses:  Swelling of lower extremity  Tension-type headache, not intractable, unspecified chronicity pattern  Essential hypertension    Medications  hydrochlorothiazide (HYDRODIURIL) tablet 25 mg (25 mg Oral Given 11/29/14 1215)  furosemide (LASIX) tablet 10 mg (not administered)  sodium chloride 0.9 % bolus 1,000 mL (0 mLs Intravenous Stopped 11/29/14 1341)  ondansetron (ZOFRAN) injection 4 mg (4 mg Intravenous Given 11/29/14 1213)  diphenhydrAMINE (BENADRYL) injection 25 mg (25 mg Intravenous Given 11/29/14 1213)  ketorolac (TORADOL) 30 MG/ML injection 30 mg (30 mg Intravenous Given 11/29/14 1334)    The patient does have a headache with hypertension of 150/100. Her blood pressure is not likely the etiology of her headache which sounds like a tension migraine . She is supposed to be taking hydrochlorothiazidebut ran out 2 weeks ago. The patient denies any symptoms of neurological impairment or TIA's; no amaurosis, diplopia, dysphasia, or unilateral disturbance of motor or sensory function. No loss of balance or vertigo.   Patient's pain completely resolved with IV fluids and medications. She was given her home dose of chlorothiazide and urinated a few times in the ED bathroom. She still has a small amount of large tumor swelling. All give a one-time small dose of 10 mg by mouth here in the ED. Given prescription for pain medicines as well as a hard core thiazide she is to follow-up with PCP return to the ED if symptoms get worse.  38 y.o.Pamela Ewing's evaluation in the Emergency Department is complete. It has been determined that no acute conditions requiring further emergency intervention are present at this time. The patient/guardian have been advised of the diagnosis and plan. We have discussed signs and  symptoms that warrant return to the ED, such as changes or worsening in symptoms.  Vital signs are stable at discharge. Filed Vitals:   11/29/14 1330  BP: 143/88  Pulse: 76  Temp:   Resp: 18    Patient/guardian has voiced understanding and agreed to follow-up with the PCP or specialist.   Dorthula Matasiffany G Rhianna Raulerson, PA-C 11/29/14 1421  Mirian MoMatthew Gentry, MD 11/29/14 [phone]

## 2015-02-25 ENCOUNTER — Emergency Department (HOSPITAL_COMMUNITY)
Admission: EM | Admit: 2015-02-25 | Discharge: 2015-02-25 | Discharge status: Against Medical Advice | Payer: Self-pay | Attending: Emergency Medicine | Admitting: Emergency Medicine

## 2015-02-25 ENCOUNTER — Emergency Department (HOSPITAL_COMMUNITY): Payer: Self-pay

## 2015-02-25 ENCOUNTER — Encounter (HOSPITAL_COMMUNITY): Payer: Self-pay | Admitting: Emergency Medicine

## 2015-02-25 DIAGNOSIS — R0981 Nasal congestion: Secondary | ICD-10-CM | POA: Insufficient documentation

## 2015-02-25 DIAGNOSIS — I10 Essential (primary) hypertension: Secondary | ICD-10-CM | POA: Insufficient documentation

## 2015-02-25 DIAGNOSIS — R05 Cough: Secondary | ICD-10-CM | POA: Insufficient documentation

## 2015-02-25 DIAGNOSIS — R0602 Shortness of breath: Secondary | ICD-10-CM | POA: Insufficient documentation

## 2015-02-25 LAB — CBC WITH DIFFERENTIAL/PLATELET
Basophils Absolute: 0 K/uL (ref 0.0–0.1)
Basophils Relative: 1 % (ref 0–1)
Eosinophils Absolute: 0.1 K/uL (ref 0.0–0.7)
Eosinophils Relative: 2 % (ref 0–5)
HCT: 35.8 % — ABNORMAL LOW (ref 36.0–46.0)
Hemoglobin: 11.5 g/dL — ABNORMAL LOW (ref 12.0–15.0)
Lymphocytes Relative: 23 % (ref 12–46)
Lymphs Abs: 1.4 K/uL (ref 0.7–4.0)
MCH: 26.6 pg (ref 26.0–34.0)
MCHC: 32.1 g/dL (ref 30.0–36.0)
MCV: 82.9 fL (ref 78.0–100.0)
Monocytes Absolute: 0.8 K/uL (ref 0.1–1.0)
Monocytes Relative: 13 % — ABNORMAL HIGH (ref 3–12)
Neutro Abs: 3.8 K/uL (ref 1.7–7.7)
Neutrophils Relative %: 61 % (ref 43–77)
Platelets: 314 K/uL (ref 150–400)
RBC: 4.32 MIL/uL (ref 3.87–5.11)
RDW: 16.4 % — ABNORMAL HIGH (ref 11.5–15.5)
WBC: 6.2 K/uL (ref 4.0–10.5)

## 2015-02-25 LAB — BASIC METABOLIC PANEL
Anion gap: 7 (ref 5–15)
BUN: 16 mg/dL (ref 6–23)
CHLORIDE: 105 mmol/L (ref 96–112)
CO2: 25 mmol/L (ref 19–32)
Calcium: 8.6 mg/dL (ref 8.4–10.5)
Creatinine, Ser: 1.04 mg/dL (ref 0.50–1.10)
GFR calc non Af Amer: 68 mL/min — ABNORMAL LOW (ref >90)
GFR, EST AFRICAN AMERICAN: 79 mL/min — AB (ref >90)
Glucose, Bld: 111 mg/dL — ABNORMAL HIGH (ref 70–99)
Potassium: 4.6 mmol/L (ref 3.5–5.1)
Sodium: 137 mmol/L (ref 135–145)

## 2015-02-25 NOTE — ED Notes (Signed)
  Pt c/o about the wait she does not feel well enough to wait. Encouraged to stay but the pt leaft

## 2015-02-25 NOTE — ED Notes (Signed)
Patient with congestion, cough and sore throat and some shortness of breath with exertion.  Patient states that this has been going on since Thursday.  Patient denies any chest pain.

## 2015-03-30 ENCOUNTER — Emergency Department (HOSPITAL_COMMUNITY)
Admission: EM | Admit: 2015-03-30 | Discharge: 2015-03-30 | Disposition: A | Discharge status: Home / Self Care | Payer: 59 | Source: Home / Self Care | Attending: Family Medicine | Admitting: Family Medicine

## 2015-03-30 ENCOUNTER — Encounter (HOSPITAL_COMMUNITY): Payer: Self-pay | Admitting: Emergency Medicine

## 2015-03-30 DIAGNOSIS — R609 Edema, unspecified: Secondary | ICD-10-CM | POA: Diagnosis not present

## 2015-03-30 DIAGNOSIS — I10 Essential (primary) hypertension: Secondary | ICD-10-CM | POA: Diagnosis not present

## 2015-03-30 DIAGNOSIS — M7918 Myalgia, other site: Secondary | ICD-10-CM

## 2015-03-30 DIAGNOSIS — M791 Myalgia: Secondary | ICD-10-CM | POA: Diagnosis not present

## 2015-03-30 MED ORDER — DICLOFENAC SODIUM 75 MG PO TBEC: 75.0000 mg | DELAYED_RELEASE_TABLET | Freq: Two times a day (BID) | ORAL | Status: AC

## 2015-03-30 MED ORDER — HYDROCHLOROTHIAZIDE 25 MG PO TABS: 25.0000 mg | ORAL_TABLET | Freq: Every day | ORAL | Status: DC

## 2015-03-30 MED ORDER — TRAMADOL HCL 50 MG PO TABS: 50.0000 mg | ORAL_TABLET | Freq: Four times a day (QID) | ORAL | Status: AC | PRN

## 2015-03-30 NOTE — Discharge Instructions (Signed)
Please restart her hydrochlorothiazide for her blood pressure. This medicine will help pull some of the extra fluid off please consider wearing some form of compression hose for your legs. This will improve with weight loss, exercise, and compression stockings. Please use the Voltaren for back pain and only use the tramadol for severe pain. Please call and schedule yourself for a appointment to establish medical care.

## 2015-03-30 NOTE — ED Notes (Signed)
Reports being out of BP medication x 1 month.   Bilateral leg swelling and left arm.  Hx of lymphedema.  Back pain, hx of disc problems.  No relief with otc meds or pain relief gels.    Problem has been on going for a while.

## 2015-03-30 NOTE — ED Provider Notes (Signed)
CSN: [id number]     Arrival date & time 03/30/15  1120 History   None    Chief Complaint  Patient presents with  . Medication Refill  . Leg Swelling  . Back Pain   (Consider location/radiation/quality/duration/timing/severity/associated sxs/prior Treatment) HPI  HTN: moved to [place] from [place] and ran out of BP meds 30 days ago. CHecks BP at work adn is high. Highest is 150/110. Denies chest pain, palpitations, shortness of breath, headaches, LOC  Leg swelling: bilat. Improves w/ rest and elevation. Worse after sitting all day at work. H/o lymphedema.   DJD: pt followed by ortho in [place] but does not establish care here in [place]. Patient was scheduled for surgery but told she had lose weight first. Back pain flares from time to time. Currently presenting with mild exacerbation. Denies loss of bowel or bladder function, falls, or any true radicular pain.  Past Medical History  Diagnosis Date  . Hypertension    Past Surgical History  Procedure Laterality Date  . Breast lumpectomy    . Cholecystectomy    . Tubal ligation    . Tonsillectomy    . Lymphedema Left    Family History  Problem Relation Age of Onset  . Cancer Mother     breast   History  Substance Use Topics  . Smoking status: Never Smoker   . Smokeless tobacco: Not on file  . Alcohol Use: No   OB History    No data available     Review of Systems Per HPI with all other pertinent systems negative.   Allergies  Azithromycin; Lisinopril; and Sulfa antibiotics  Home Medications   Prior to Admission medications   Medication Sig Start Date End Date Taking? Authorizing Provider  ibuprofen (ADVIL,MOTRIN) 800 MG tablet Take 800 mg by mouth every 6 (six) hours as needed for moderate pain. 07/10/14  Yes Tatyana Kirichenko, PA-C  diclofenac (VOLTAREN) 75 MG EC tablet Take 1 tablet (75 mg total) by mouth 2 (two) times daily. 03/30/15   Ozella Rocks, MD  hydrochlorothiazide (HYDRODIURIL) 25 MG tablet Take  1 tablet (25 mg total) by mouth at bedtime. 03/30/15   Ozella Rocks, MD  traMADol (ULTRAM) 50 MG tablet Take 1 tablet (50 mg total) by mouth every 6 (six) hours as needed. 03/30/15   Ozella Rocks, MD   BP 149/99 mmHg  Pulse 88  Temp(Src) 97.8 F (36.6 C) (Oral)  Resp 16  SpO2 100%  LMP 03/11/2015 Physical Exam Physical Exam  Constitutional: oriented to person, place, and time. appears well-developed and well-nourished. No distress.  HENT:  Head: Normocephalic and atraumatic.  Eyes: EOMI. PERRL.  Neck: Normal range of motion.  Cardiovascular: RRR, no m/r/g, 2+ distal pulses, 1+ bilateral lower cavity edema. Pulmonary/Chest: Effort normal and breath sounds normal. No respiratory distress.  Abdominal: Soft. Bowel sounds are normal. NonTTP, no distension.  Musculoskeletal: Normal range of motion. Non ttp, no effusion.  Neurological: alert and oriented to person, place, and time.  Skin: Skin is warm. No rash noted. non diaphoretic.  Psychiatric: normal mood and affect. behavior is normal. Judgment and thought content normal.   ED Course  Procedures (including critical care time) Labs Review Labs Reviewed - No data to display  Imaging Review No results found.   MDM   1. Essential hypertension   2. Musculoskeletal pain   3. Dependent edema    Reformatted HCTZ. Start Voltaren. Limited number of tramadol provided. Recommended patient start using compression stockings for  dependent edema. Patient to establish care with a new PCP.    Ozella Rocksavid J Merrell, MD 03/30/15 [phone]

## 2016-03-15 ENCOUNTER — Encounter (HOSPITAL_COMMUNITY): Payer: Self-pay | Admitting: *Deleted

## 2016-03-15 ENCOUNTER — Emergency Department (INDEPENDENT_AMBULATORY_CARE_PROVIDER_SITE_OTHER)
Admission: EM | Admit: 2016-03-15 | Discharge: 2016-03-15 | Disposition: A | Discharge status: Home / Self Care | Payer: 59 | Source: Home / Self Care | Attending: Family Medicine | Admitting: Family Medicine

## 2016-03-15 DIAGNOSIS — K2100 Gastro-esophageal reflux disease with esophagitis, without bleeding: Secondary | ICD-10-CM

## 2016-03-15 DIAGNOSIS — M6248 Contracture of muscle, other site: Secondary | ICD-10-CM

## 2016-03-15 DIAGNOSIS — M62838 Other muscle spasm: Secondary | ICD-10-CM

## 2016-03-15 MED ORDER — KETOROLAC TROMETHAMINE 60 MG/2ML IM SOLN
60.0000 mg | Freq: Once | INTRAMUSCULAR | Status: AC
Start: 2016-03-15 — End: 2016-03-15
  Administered 2016-03-15: 60 mg via INTRAMUSCULAR

## 2016-03-15 MED ORDER — PANTOPRAZOLE SODIUM 40 MG PO TBEC: 40.0000 mg | DELAYED_RELEASE_TABLET | Freq: Every day | ORAL | Status: AC

## 2016-03-15 MED ORDER — KETOROLAC TROMETHAMINE 60 MG/2ML IM SOLN
INTRAMUSCULAR | Status: AC
  Filled 2016-03-15: qty 2

## 2016-03-15 MED ORDER — METHOCARBAMOL 500 MG PO TABS: 500.0000 mg | ORAL_TABLET | Freq: Four times a day (QID) | ORAL | Status: AC | PRN

## 2016-03-15 MED ORDER — MELOXICAM 15 MG PO TABS: 7.5000 mg | ORAL_TABLET | Freq: Every day | ORAL | Status: AC

## 2016-03-15 NOTE — Discharge Instructions (Signed)
Your symptoms are likely from spasm and strain of the trapezius muscle. This may be coming from spinal stenosis but more than likely is from persistent use of the right arm and shoulder region. Please murmur to perform the 3 different shoulder exercises shown you during your visit. He may start taking meloxicam at 24 hours and should do so daily for the next 2 weeks. He may start the Robaxin immediately. This is a muscle relaxer and may make you a little bit sleepy. Please use the Protonix every day to help with reflux which may be worsened by the meloxicam. Please follow-up with a spinal surgeon. Please murmur to apply heat and gentle massage to help ease the muscle tenderness.

## 2016-03-15 NOTE — ED Provider Notes (Signed)
CSN: [id number]     Arrival date & time 03/15/16  1450 History   First MD Initiated Contact with Patient 03/15/16 1738     Chief Complaint  Patient presents with  . Shoulder Pain   (Consider location/radiation/quality/duration/timing/severity/associated sxs/prior Treatment) HPI  Right shoulder pain. This is more closely defined as pain of the muscle area between the shoulder and the neck. Patient states she's had a history of degenerative disc disease of the neck and was said to have C-spine surgery several years ago but was told she waited too much. Patient states that this pain comes and goes. Most recent induration and pain came on one month ago. Right-sided. Intermittent. Worse with certain movements. Improves with rest. Has not taken any medications for her symptoms. Denies any radiation of pain down the shoulder arm or hand. Patient is right-handed dominant. Denies any injury to the area, headache, neck stiffness, lightheadedness, dizziness, chest pain, shortness breath, palpitations.  Past Medical History  Diagnosis Date  . Hypertension    Past Surgical History  Procedure Laterality Date  . Breast lumpectomy    . Cholecystectomy    . Tubal ligation    . Tonsillectomy    . Lymphedema Left    Family History  Problem Relation Age of Onset  . Cancer Mother     breast   Social History  Substance Use Topics  . Smoking status: Never Smoker   . Smokeless tobacco: None  . Alcohol Use: No   OB History    No data available     Review of Systems Per HPI with all other pertinent systems negative.    Allergies  Azithromycin; Lisinopril; and Sulfa antibiotics  Home Medications   Prior to Admission medications   Medication Sig Start Date End Date Taking? Authorizing Provider  diclofenac (VOLTAREN) 75 MG EC tablet Take 1 tablet (75 mg total) by mouth 2 (two) times daily. 03/30/15   Ozella Rocksavid J Merrell, MD  hydrochlorothiazide (HYDRODIURIL) 25 MG tablet Take 1 tablet (25 mg total)  by mouth at bedtime. 03/30/15   Ozella Rocksavid J Merrell, MD  ibuprofen (ADVIL,MOTRIN) 800 MG tablet Take 800 mg by mouth every 6 (six) hours as needed for moderate pain. 07/10/14   Tatyana Kirichenko, PA-C  meloxicam (MOBIC) 15 MG tablet Take 0.5-1 tablets (7.5-15 mg total) by mouth daily. 03/15/16   Ozella Rocksavid J Merrell, MD  methocarbamol (ROBAXIN) 500 MG tablet Take 1-2 tablets (500-1,000 mg total) by mouth every 6 (six) hours as needed for muscle spasms. 03/15/16   Ozella Rocksavid J Merrell, MD  pantoprazole (PROTONIX) 40 MG tablet Take 1 tablet (40 mg total) by mouth daily. 03/15/16   Ozella Rocksavid J Merrell, MD  traMADol (ULTRAM) 50 MG tablet Take 1 tablet (50 mg total) by mouth every 6 (six) hours as needed. 03/30/15   Ozella Rocksavid J Merrell, MD   Meds Ordered and Administered this Visit  Medications - No data to display  BP 105/69 mmHg  Pulse 91  Temp(Src) 98.8 F (37.1 C) (Oral)  Resp 16  SpO2 100%  LMP 03/04/2016 No data found.   Physical Exam Physical Exam  Constitutional: oriented to person, place, and time. appears well-developed and well-nourished. No distress.  HENT:  Head: Normocephalic and atraumatic.  Eyes: EOMI. PERRL.  Neck: Normal range of motion.  Cardiovascular: RRR, no m/r/g, 2+ distal pulses,  Pulmonary/Chest: Effort normal and breath sounds normal. No respiratory distress.  Abdominal: Soft. Bowel sounds are normal. NonTTP, no distension.  Musculoskeletal: Right upper extremity with limited range  of motion secondary to pain. Passive range of motion normal. Stiffness of the right trapezius muscle compared to the left. Neck with full range of motion.  Neurological: alert and oriented to person, place, and time.  Skin: Skin is warm. No rash noted. non diaphoretic.  Psychiatric: normal mood and affect. behavior is normal. Judgment and thought content normal.   ED Course  Procedures (including critical care time)  Labs Review Labs Reviewed - No data to display  Imaging Review No results  found.   Visual Acuity Review  Right Eye Distance:   Left Eye Distance:   Bilateral Distance:    Right Eye Near:   Left Eye Near:    Bilateral Near:         MDM   1. Trapezius muscle spasm   2. Gastroesophageal reflux disease with esophagitis    Toradol 60 mg IM given in clinic. Start meloxicam at 24 hours. Start Robaxin. Heat, massage, exercises demonstrated. Patient with significant history of reflux requiring multiple different medications. Will try get the patientx this may have a [employer] and patient may need to have Zantac at this time. No PCP in the area she recently moved from [place].    Ozella Rocks, MD 03/15/16 [phone]

## 2016-03-15 NOTE — ED Notes (Signed)
Pt   Reports  Pain  r  Shoulder     X  2  Months   Worse  Over  The  Last  Several  Days        Denys  Any  specefic  Injury    rom is  Decreased    Symptoms   Not  Releived  By  otc   meds

## 2016-05-31 ENCOUNTER — Encounter (HOSPITAL_COMMUNITY): Payer: Self-pay | Admitting: *Deleted

## 2016-05-31 ENCOUNTER — Emergency Department (HOSPITAL_COMMUNITY)
Admission: EM | Admit: 2016-05-31 | Discharge: 2016-05-31 | Disposition: A | Discharge status: Home / Self Care | Payer: 59 | Attending: Emergency Medicine | Admitting: Emergency Medicine

## 2016-05-31 ENCOUNTER — Encounter (HOSPITAL_COMMUNITY): Payer: Self-pay | Admitting: Emergency Medicine

## 2016-05-31 ENCOUNTER — Ambulatory Visit (HOSPITAL_COMMUNITY)
Admission: EM | Admit: 2016-05-31 | Discharge: 2016-05-31 | Disposition: A | Discharge status: Nursing Home (Includes ICF) | Payer: 59 | Attending: Family Medicine | Admitting: Family Medicine

## 2016-05-31 ENCOUNTER — Emergency Department (HOSPITAL_COMMUNITY): Payer: 59

## 2016-05-31 DIAGNOSIS — I10 Essential (primary) hypertension: Secondary | ICD-10-CM | POA: Diagnosis not present

## 2016-05-31 DIAGNOSIS — Z79899 Other long term (current) drug therapy: Secondary | ICD-10-CM | POA: Diagnosis not present

## 2016-05-31 DIAGNOSIS — M25471 Effusion, right ankle: Secondary | ICD-10-CM | POA: Diagnosis not present

## 2016-05-31 DIAGNOSIS — R079 Chest pain, unspecified: Secondary | ICD-10-CM | POA: Diagnosis not present

## 2016-05-31 DIAGNOSIS — Z791 Long term (current) use of non-steroidal anti-inflammatories (NSAID): Secondary | ICD-10-CM | POA: Insufficient documentation

## 2016-05-31 DIAGNOSIS — M25472 Effusion, left ankle: Secondary | ICD-10-CM | POA: Insufficient documentation

## 2016-05-31 LAB — CBC
HCT: 35 % — ABNORMAL LOW (ref 36.0–46.0)
Hemoglobin: 11 g/dL — ABNORMAL LOW (ref 12.0–15.0)
MCH: 25.4 pg — AB (ref 26.0–34.0)
MCHC: 31.4 g/dL (ref 30.0–36.0)
MCV: 80.8 fL (ref 78.0–100.0)
PLATELETS: 371 10*3/uL (ref 150–400)
RBC: 4.33 MIL/uL (ref 3.87–5.11)
RDW: 17 % — AB (ref 11.5–15.5)
WBC: 8.6 10*3/uL (ref 4.0–10.5)

## 2016-05-31 LAB — BASIC METABOLIC PANEL
Anion gap: 6 (ref 5–15)
BUN: 15 mg/dL (ref 6–20)
CALCIUM: 9.2 mg/dL (ref 8.9–10.3)
CO2: 24 mmol/L (ref 22–32)
CREATININE: 0.95 mg/dL (ref 0.44–1.00)
Chloride: 105 mmol/L (ref 101–111)
GFR calc non Af Amer: >60 mL/min (ref >60)
Glucose, Bld: 91 mg/dL (ref 65–99)
Potassium: 4.4 mmol/L (ref 3.5–5.1)
SODIUM: 135 mmol/L (ref 135–145)

## 2016-05-31 LAB — I-STAT TROPONIN, ED
TROPONIN I, POC: 0 ng/mL (ref 0.00–0.08)
Troponin i, poc: 0 ng/mL (ref 0.00–0.08)

## 2016-05-31 MED ORDER — HYDROCHLOROTHIAZIDE 25 MG PO TABS: 25.0000 mg | ORAL_TABLET | Freq: Every day | ORAL | Status: AC

## 2016-05-31 MED ORDER — HYDROCODONE-ACETAMINOPHEN 5-325 MG PO TABS
1.0000 | ORAL_TABLET | ORAL | Status: AC
Start: 2016-05-31 — End: 2016-05-31
  Administered 2016-05-31: 1 via ORAL
  Filled 2016-05-31: qty 1

## 2016-05-31 MED ORDER — HYDROCHLOROTHIAZIDE 25 MG PO TABS
25.0000 mg | ORAL_TABLET | Freq: Every day | ORAL | Status: DC
  Administered 2016-05-31: 25 mg via ORAL
  Filled 2016-05-31: qty 1

## 2016-05-31 MED ORDER — AMLODIPINE BESYLATE 5 MG PO TABS
5.0000 mg | ORAL_TABLET | Freq: Every day | ORAL | Status: DC
  Administered 2016-05-31: 5 mg via ORAL
  Filled 2016-05-31: qty 1

## 2016-05-31 MED ORDER — NAPROXEN 500 MG PO TABS: 500.0000 mg | ORAL_TABLET | Freq: Two times a day (BID) | ORAL | Status: AC

## 2016-05-31 MED ORDER — AMLODIPINE BESYLATE 5 MG PO TABS: 5.0000 mg | ORAL_TABLET | Freq: Every day | ORAL | Status: AC

## 2016-05-31 NOTE — ED Notes (Signed)
The [t has high bo but has been out of bp med for awhile

## 2016-05-31 NOTE — ED Provider Notes (Signed)
CSN: [id number]     Arrival date & time 05/31/16  1503 History   None    Chief Complaint  Patient presents with  . Chest Pain   (Consider location/radiation/quality/duration/timing/severity/associated sxs/prior Treatment) HPI Comments: Patient c/o substernal and left sided sharp chest dicomfort that is persistent.  She has hx of HTN and hyperlipidemia.  She is not sure if she has family hx of CAD.  Her parents are deceased from unknown reasons.  She denies nausea or radiation of pain.  Patient is a 40 y.o. female presenting with chest pain. The history is provided by the patient.  Chest Pain Pain location:  L chest and substernal area Pain quality: aching   Pain radiates to:  Precordial region Pain radiates to the back: no   Pain severity:  Moderate Duration:  2 hours Timing:  Constant Progression:  Worsening Chronicity:  New Relieved by:  None tried Worsened by:  Nothing tried Ineffective treatments:  None tried   Past Medical History  Diagnosis Date  . Hypertension    Past Surgical History  Procedure Laterality Date  . Breast lumpectomy    . Cholecystectomy    . Tubal ligation    . Tonsillectomy    . Lymphedema Left    Family History  Problem Relation Age of Onset  . Cancer Mother     breast   Social History  Substance Use Topics  . Smoking status: Never Smoker   . Smokeless tobacco: None  . Alcohol Use: No   OB History    No data available     Review of Systems  Constitutional: Negative.   HENT: Negative.   Eyes: Negative.   Respiratory: Negative.   Cardiovascular: Positive for chest pain.  Gastrointestinal: Negative.   Endocrine: Negative.   Genitourinary: Negative.   Musculoskeletal: Negative.   Skin: Negative.   Allergic/Immunologic: Negative.   Hematological: Negative.   Psychiatric/Behavioral: Negative.     Allergies  Azithromycin; Lisinopril; and Sulfa antibiotics  Home Medications   Prior to Admission medications   Medication Sig  Start Date End Date Taking? Authorizing Provider  diclofenac (VOLTAREN) 75 MG EC tablet Take 1 tablet (75 mg total) by mouth 2 (two) times daily. 03/30/15   Ozella Rocks, MD  hydrochlorothiazide (HYDRODIURIL) 25 MG tablet Take 1 tablet (25 mg total) by mouth at bedtime. 03/30/15   Ozella Rocks, MD  ibuprofen (ADVIL,MOTRIN) 800 MG tablet Take 800 mg by mouth every 6 (six) hours as needed for moderate pain. 07/10/14   Tatyana Kirichenko, PA-C  meloxicam (MOBIC) 15 MG tablet Take 0.5-1 tablets (7.5-15 mg total) by mouth daily. 03/15/16   Ozella Rocks, MD  methocarbamol (ROBAXIN) 500 MG tablet Take 1-2 tablets (500-1,000 mg total) by mouth every 6 (six) hours as needed for muscle spasms. 03/15/16   Ozella Rocks, MD  pantoprazole (PROTONIX) 40 MG tablet Take 1 tablet (40 mg total) by mouth daily. 03/15/16   Ozella Rocks, MD  traMADol (ULTRAM) 50 MG tablet Take 1 tablet (50 mg total) by mouth every 6 (six) hours as needed. 03/30/15   Ozella Rocks, MD   Meds Ordered and Administered this Visit  Medications - No data to display  BP 151/98 mmHg  Pulse 102  Temp(Src) 98.3 F (36.8 C) (Oral)  Resp 20  SpO2 100%  LMP 05/18/2016 No data found.   Physical Exam  Constitutional: She is oriented to person, place, and time. She appears well-developed and well-nourished.  HENT:  Head:  Normocephalic.  Right Ear: External ear normal.  Left Ear: External ear normal.  Eyes: Pupils are equal, round, and reactive to light.  Neck: Normal range of motion. Neck supple.  Cardiovascular: Normal rate, regular rhythm and normal heart sounds.   Pulmonary/Chest: Effort normal and breath sounds normal.  Abdominal: Soft. Bowel sounds are normal.  Musculoskeletal: She exhibits no tenderness.  Anterior chest and sternum and left intercostals palpated and no tenderness appreciated.  Neurological: She is alert and oriented to person, place, and time.    ED Course  Procedures (including critical care  time)  Labs Review Labs Reviewed - No data to display  Imaging Review No results found.   Visual Acuity Review  Right Eye Distance:   Left Eye Distance:   Bilateral Distance:    Right Eye Near:   Left Eye Near:    Bilateral Near:         MDM  Chest pain - EKG normal  Explained to patient that since she is still having chest pain this could be from cardiac etiology and recommend she go to ED to have this addressed appropriately.      Deatra CanterWilliam J Bence Trapp, FNP 05/31/16 [phone]

## 2016-05-31 NOTE — ED Notes (Signed)
Her pain has increased

## 2016-05-31 NOTE — ED Notes (Signed)
Pt sent here from [facility] due to left side stabbing chest pain that is intermittent. Denies sob. No acute distress noted at triage, ekg done.

## 2016-05-31 NOTE — ED Provider Notes (Signed)
CSN: [id number]     Arrival date & time 05/31/16  1638 History  By signing my name below, I, Tanda Rockers, attest that this documentation has been prepared under the direction and in the presence of Linwood Dibbles, MD. Electronically Signed: Tanda Rockers, ED Scribe. 05/31/2016. 8:18 PM.   Chief Complaint  Patient presents with  . Chest Pain   Patient is a 40 y.o. female presenting with chest pain. The history is provided by the patient. No language interpreter was used.  Chest Pain Pain location:  L chest Pain radiates to:  Does not radiate Pain radiates to the back: no   Pain severity:  Moderate Onset quality:  Gradual Duration:  8 hours Timing:  Constant Progression:  Unchanged Chronicity:  New Associated symptoms: headache   Associated symptoms: no nausea, no shortness of breath and not vomiting   Risk factors: hypertension   Risk factors: no diabetes mellitus, no high cholesterol, not female and no prior DVT/PE     HPI Comments: Pamela Ewing is a 40 y.o. female with PMHx HTN, who presents to the Emergency Department complaining of gradual onset, constant, non-radiating, left sided chest pain that began today around noon (approximatley 8 hours ago). Pt reports that she was at work when the pain began. She has never had this type of pain in the past. She also complains of a headache. Pt has hx of HTN but has been out of her antihypertensive medication for some time. No hx cardiac issues, lung issues, or DVT/PE. Pt is unsure about FHx of cardiac issues due to both parents being deceased. Denies shortness of breath, nausea, vomiting, new leg swelling, or any other associated symptoms. Pt does have swelling to both ankles but reports hx of lymphedema.   Past Medical History  Diagnosis Date  . Hypertension    Past Surgical History  Procedure Laterality Date  . Breast lumpectomy    . Cholecystectomy    . Tubal ligation    . Tonsillectomy    . Lymphedema Left    Family History   Problem Relation Age of Onset  . Cancer Mother     breast   Social History  Substance Use Topics  . Smoking status: Never Smoker   . Smokeless tobacco: None  . Alcohol Use: No   OB History    No data available     Review of Systems  Respiratory: Negative for shortness of breath.   Cardiovascular: Positive for chest pain. Negative for leg swelling.  Gastrointestinal: Negative for nausea and vomiting.  Neurological: Positive for headaches.  All other systems reviewed and are negative.  Allergies  Azithromycin; Lisinopril; and Sulfa antibiotics  Home Medications   Prior to Admission medications   Medication Sig Start Date End Date Taking? Authorizing Provider  ibuprofen (ADVIL,MOTRIN) 200 MG tablet Take 400-800 mg by mouth every 6 (six) hours as needed for moderate pain.    Yes Historical Provider, MD  ibuprofen (ADVIL,MOTRIN) 800 MG tablet Take 800-1,600 mg by mouth every 8 (eight) hours as needed for moderate pain.  07/10/14  Yes Tatyana Kirichenko, PA-C  pantoprazole (PROTONIX) 40 MG tablet Take 1 tablet (40 mg total) by mouth daily. Patient taking differently: Take 40 mg by mouth daily as needed (for acid reflux).  03/15/16  Yes Ozella Rocks, MD  amLODipine (NORVASC) 5 MG tablet Take 1 tablet (5 mg total) by mouth daily. Reported on 05/31/2016 05/31/16   Linwood Dibbles, MD  diclofenac (VOLTAREN) 75 MG EC tablet Take 1  tablet (75 mg total) by mouth 2 (two) times daily. 03/30/15   Ozella Rocks, MD  hydrochlorothiazide (HYDRODIURIL) 25 MG tablet Take 1 tablet (25 mg total) by mouth at bedtime. 05/31/16   Linwood Dibbles, MD  meloxicam (MOBIC) 15 MG tablet Take 0.5-1 tablets (7.5-15 mg total) by mouth daily. 03/15/16   Ozella Rocks, MD  methocarbamol (ROBAXIN) 500 MG tablet Take 1-2 tablets (500-1,000 mg total) by mouth every 6 (six) hours as needed for muscle spasms. 03/15/16   Ozella Rocks, MD  naproxen (NAPROSYN) 500 MG tablet Take 1 tablet (500 mg total) by mouth 2 (two) times daily  with a meal. As needed for pain 05/31/16   Linwood Dibbles, MD  traMADol (ULTRAM) 50 MG tablet Take 1 tablet (50 mg total) by mouth every 6 (six) hours as needed. 03/30/15   Ozella Rocks, MD   BP 154/104 mmHg  Pulse 86  Temp(Src) 98.3 F (36.8 C) (Oral)  Resp 18  SpO2 100%  LMP 05/18/2016 (Approximate)   Physical Exam  Constitutional: She appears well-developed and well-nourished. No distress.  HENT:  Head: Normocephalic and atraumatic.  Right Ear: External ear normal.  Left Ear: External ear normal.  Eyes: Conjunctivae are normal. Right eye exhibits no discharge. Left eye exhibits no discharge. No scleral icterus.  Neck: Neck supple. No tracheal deviation present.  Cardiovascular: Normal rate, regular rhythm and intact distal pulses.   Pulmonary/Chest: Effort normal and breath sounds normal. No stridor. No respiratory distress. She has no wheezes. She has no rales.  Abdominal: Soft. Bowel sounds are normal. She exhibits no distension. There is no tenderness. There is no rebound and no guarding.  Musculoskeletal: She exhibits edema. She exhibits no tenderness.  Mild edema bilateral lower ankles  Neurological: She is alert. She has normal strength. No cranial nerve deficit (no facial droop, extraocular movements intact, no slurred speech) or sensory deficit. She exhibits normal muscle tone. She displays no seizure activity. Coordination normal.  Skin: Skin is warm and dry. No rash noted.  Psychiatric: She has a normal mood and affect.  Nursing note and vitals reviewed.   ED Course  Procedures (including critical care time)  DIAGNOSTIC STUDIES: Oxygen Saturation is 100% on RA, normal by my interpretation.    COORDINATION OF CARE: 8:17 PM-Discussed treatment plan which includes repeat troponin with pt at bedside and pt agreed to plan.   Labs Review Labs Reviewed  CBC - Abnormal; Notable for the following:    Hemoglobin 11.0 (*)    HCT 35.0 (*)    MCH 25.4 (*)    RDW 17.0 (*)     All other components within normal limits  BASIC METABOLIC PANEL  I-STAT TROPOININ, ED  Rosezena Sensor, ED    Imaging Review Dg Chest 2 View  05/31/2016  CLINICAL DATA:  Patient with intermittent left-sided sharp chest pain. EXAM: CHEST  2 VIEW COMPARISON:  Chest radiograph 02/25/2015. FINDINGS: Normal cardiac and mediastinal contours. No consolidative pulmonary opacities. No pleural effusion or pneumothorax. Cholecystectomy clips. IMPRESSION: No active cardiopulmonary disease. Electronically Signed   By: Annia Belt M.D.   On: 05/31/2016 18:05   I have personally reviewed and evaluated these images and lab results as part of my medical decision-making.   EKG Interpretation None      MDM   Final diagnoses:  Chest pain with low risk for cardiac etiology  Essential hypertension   Heart score of 1.  Low risk for adverse cardiac event.  Doubt ACS.  No pe risk factors.  Perc negative. Will dc home.  Refill her blood pressure medications.  Follow up with PCP  I personally performed the services described in this documentation, which was scribed in my presence.  The recorded information has been reviewed and is accurate.      Linwood DibblesJon Thamas Appleyard, MD 05/31/16 [phone]

## 2016-05-31 NOTE — ED Notes (Signed)
Pt on phone and will not get off.... Adv pt to open door when finished.

## 2016-05-31 NOTE — ED Notes (Signed)
See physicians note... C/o CP onset today... States not radiate to any other part and denies SOB, n/v.... A&O x4... No acute distress.

## 2016-05-31 NOTE — Discharge Instructions (Signed)
Nonspecific Chest Pain  °Chest pain can be caused by many different conditions. There is always a chance that your pain could be related to something serious, such as a heart attack or a blood clot in your lungs. Chest pain can also be caused by conditions that are not life-threatening. If you have chest pain, it is very important to follow up with your health care provider. °CAUSES  °Chest pain can be caused by: °· Heartburn. °· Pneumonia or bronchitis. °· Anxiety or stress. °· Inflammation around your heart (pericarditis) or lung (pleuritis or pleurisy). °· A blood clot in your lung. °· A collapsed lung (pneumothorax). It can develop suddenly on its own (spontaneous pneumothorax) or from trauma to the chest. °· Shingles infection (varicella-zoster virus). °· Heart attack. °· Damage to the bones, muscles, and cartilage that make up your chest wall. This can include: °¨ Bruised bones due to injury. °¨ Strained muscles or cartilage due to frequent or repeated coughing or overwork. °¨ Fracture to one or more ribs. °¨ Sore cartilage due to inflammation (costochondritis). °RISK FACTORS  °Risk factors for chest pain may include: °· Activities that increase your risk for trauma or injury to your chest. °· Respiratory infections or conditions that cause frequent coughing. °· Medical conditions or overeating that can cause heartburn. °· Heart disease or family history of heart disease. °· Conditions or health behaviors that increase your risk of developing a blood clot. °· Having had chicken pox (varicella zoster). °SIGNS AND SYMPTOMS °Chest pain can feel like: °· Burning or tingling on the surface of your chest or deep in your chest. °· Crushing, pressure, aching, or squeezing pain. °· Dull or sharp pain that is worse when you move, cough, or take a deep breath. °· Pain that is also felt in your back, neck, shoulder, or arm, or pain that spreads to any of these areas. °Your chest pain may come and go, or it may stay  constant. °DIAGNOSIS °Lab tests or other studies may be needed to find the cause of your pain. Your health care provider may have you take a test called an ambulatory ECG (electrocardiogram). An ECG records your heartbeat patterns at the time the test is performed. You may also have other tests, such as: °· Transthoracic echocardiogram (TTE). During echocardiography, sound waves are used to create a picture of all of the heart structures and to look at how blood flows through your heart. °· Transesophageal echocardiogram (TEE). This is a more advanced imaging test that obtains images from inside your body. It allows your health care provider to see your heart in finer detail. °· Cardiac monitoring. This allows your health care provider to monitor your heart rate and rhythm in real time. °· Holter monitor. This is a portable device that records your heartbeat and can help to diagnose abnormal heartbeats. It allows your health care provider to track your heart activity for several days, if needed. °· Stress tests. These can be done through exercise or by taking medicine that makes your heart beat more quickly. °· Blood tests. °· Imaging tests. °TREATMENT  °Your treatment depends on what is causing your chest pain. Treatment may include: °· Medicines. These may include: °¨ Acid blockers for heartburn. °¨ Anti-inflammatory medicine. °¨ Pain medicine for inflammatory conditions. °¨ Antibiotic medicine, if an infection is present. °¨ Medicines to dissolve blood clots. °¨ Medicines to treat coronary artery disease. °· Supportive care for conditions that do not require medicines. This may include: °¨ Resting. °¨ Applying heat   or cold packs to injured areas. °¨ Limiting activities until pain decreases. °HOME CARE INSTRUCTIONS °· If you were prescribed an antibiotic medicine, finish it all even if you start to feel better. °· Avoid any activities that bring on chest pain. °· Do not use any tobacco products, including  cigarettes, chewing tobacco, or electronic cigarettes. If you need help quitting, ask your health care provider. °· Do not drink alcohol. °· Take medicines only as directed by your health care provider. °· Keep all follow-up visits as directed by your health care provider. This is important. This includes any further testing if your chest pain does not go away. °· If heartburn is the cause for your chest pain, you may be told to keep your head raised (elevated) while sleeping. This reduces the chance that acid will go from your stomach into your esophagus. °· Make lifestyle changes as directed by your health care provider. These may include: °¨ Getting regular exercise. Ask your health care provider to suggest some activities that are safe for you. °¨ Eating a heart-healthy diet. A registered dietitian can help you to learn healthy eating options. °¨ Maintaining a healthy weight. °¨ Managing diabetes, if necessary. °¨ Reducing stress. °SEEK MEDICAL CARE IF: °· Your chest pain does not go away after treatment. °· You have a rash with blisters on your chest. °· You have a fever. °SEEK IMMEDIATE MEDICAL CARE IF:  °· Your chest pain is worse. °· You have an increasing cough, or you cough up blood. °· You have severe abdominal pain. °· You have severe weakness. °· You faint. °· You have chills. °· You have sudden, unexplained chest discomfort. °· You have sudden, unexplained discomfort in your arms, back, neck, or jaw. °· You have shortness of breath at any time. °· You suddenly start to sweat, or your skin gets clammy. °· You feel nauseous or you vomit. °· You suddenly feel light-headed or dizzy. °· Your heart begins to beat quickly, or it feels like it is skipping beats. °These symptoms may represent a serious problem that is an emergency. Do not wait to see if the symptoms will go away. Get medical help right away. Call your local emergency services (911 in the [place] Do not drive yourself to the hospital. °  °This  information is not intended to replace advice given to you by your health care provider. Make sure you discuss any questions you have with your health care provider. °  °Document Released: 09/11/2005 Document Revised: 12/23/2014 Document Reviewed: 07/08/2014 °Elsevier Interactive Patient Education ©2016 [employer] ° °

## 2016-05-31 NOTE — ED Notes (Signed)
Pain is better 

## 2016-05-31 NOTE — ED Notes (Signed)
The pt is c/o lt sided chest pain since 1200n today while at work  No previous history

## 2018-02-26 ENCOUNTER — Other Ambulatory Visit: Payer: Self-pay | Admitting: Physician Assistant

## 2018-02-26 DIAGNOSIS — Z1231 Encounter for screening mammogram for malignant neoplasm of breast: Secondary | ICD-10-CM

## 2018-04-10 ENCOUNTER — Ambulatory Visit
Admission: RE | Admit: 2018-04-10 | Discharge: 2018-04-10 | Disposition: A | Discharge status: Home / Self Care | Payer: 59 | Source: Ambulatory Visit | Attending: Physician Assistant | Admitting: Physician Assistant

## 2018-04-10 DIAGNOSIS — Z1231 Encounter for screening mammogram for malignant neoplasm of breast: Secondary | ICD-10-CM

## 2018-04-16 ENCOUNTER — Other Ambulatory Visit: Payer: Self-pay | Admitting: Physician Assistant

## 2018-04-16 DIAGNOSIS — N6320 Unspecified lump in the left breast, unspecified quadrant: Secondary | ICD-10-CM

## 2018-04-17 ENCOUNTER — Other Ambulatory Visit: Payer: Self-pay | Admitting: Physician Assistant

## 2018-04-17 ENCOUNTER — Ambulatory Visit
Admission: RE | Admit: 2018-04-17 | Discharge: 2018-04-17 | Disposition: A | Discharge status: Home / Self Care | Payer: 59 | Source: Ambulatory Visit | Attending: Physician Assistant | Admitting: Physician Assistant

## 2018-04-17 DIAGNOSIS — N6320 Unspecified lump in the left breast, unspecified quadrant: Secondary | ICD-10-CM

## 2018-04-17 DIAGNOSIS — R599 Enlarged lymph nodes, unspecified: Secondary | ICD-10-CM

## 2018-07-13 ENCOUNTER — Encounter (HOSPITAL_BASED_OUTPATIENT_CLINIC_OR_DEPARTMENT_OTHER): Payer: Self-pay | Admitting: *Deleted

## 2018-07-13 ENCOUNTER — Emergency Department (HOSPITAL_BASED_OUTPATIENT_CLINIC_OR_DEPARTMENT_OTHER)
Admission: EM | Admit: 2018-07-13 | Discharge: 2018-07-13 | Disposition: A | Discharge status: Home / Self Care | Payer: 59 | Attending: Emergency Medicine | Admitting: Emergency Medicine

## 2018-07-13 ENCOUNTER — Other Ambulatory Visit: Payer: Self-pay

## 2018-07-13 DIAGNOSIS — I10 Essential (primary) hypertension: Secondary | ICD-10-CM | POA: Diagnosis not present

## 2018-07-13 DIAGNOSIS — N938 Other specified abnormal uterine and vaginal bleeding: Secondary | ICD-10-CM | POA: Insufficient documentation

## 2018-07-13 DIAGNOSIS — Z79899 Other long term (current) drug therapy: Secondary | ICD-10-CM | POA: Diagnosis not present

## 2018-07-13 DIAGNOSIS — R109 Unspecified abdominal pain: Secondary | ICD-10-CM | POA: Diagnosis present

## 2018-07-13 LAB — BASIC METABOLIC PANEL
ANION GAP: 10 (ref 5–15)
BUN: 21 mg/dL — AB (ref 6–20)
CO2: 23 mmol/L (ref 22–32)
Calcium: 8.8 mg/dL — ABNORMAL LOW (ref 8.9–10.3)
Chloride: 105 mmol/L (ref 98–111)
Creatinine, Ser: 1.44 mg/dL — ABNORMAL HIGH (ref 0.44–1.00)
GFR calc Af Amer: 51 mL/min — ABNORMAL LOW (ref >60)
GFR, EST NON AFRICAN AMERICAN: 44 mL/min — AB (ref >60)
Glucose, Bld: 121 mg/dL — ABNORMAL HIGH (ref 70–99)
POTASSIUM: 3.8 mmol/L (ref 3.5–5.1)
SODIUM: 138 mmol/L (ref 135–145)

## 2018-07-13 LAB — URINALYSIS, ROUTINE W REFLEX MICROSCOPIC
GLUCOSE, UA: NEGATIVE mg/dL
Ketones, ur: 15 mg/dL — AB
Nitrite: NEGATIVE
PH: 5.5 (ref 5.0–8.0)
Protein, ur: 100 mg/dL — AB
SPECIFIC GRAVITY, URINE: 1.025 (ref 1.005–1.030)

## 2018-07-13 LAB — CBC WITH DIFFERENTIAL/PLATELET
BASOS ABS: 0 10*3/uL (ref 0.0–0.1)
Basophils Relative: 0 %
EOS PCT: 1 %
Eosinophils Absolute: 0.1 10*3/uL (ref 0.0–0.7)
HCT: 34.4 % — ABNORMAL LOW (ref 36.0–46.0)
Hemoglobin: 11.3 g/dL — ABNORMAL LOW (ref 12.0–15.0)
LYMPHS PCT: 40 %
Lymphs Abs: 3.6 10*3/uL (ref 0.7–4.0)
MCH: 26.8 pg (ref 26.0–34.0)
MCHC: 32.8 g/dL (ref 30.0–36.0)
MCV: 81.5 fL (ref 78.0–100.0)
Monocytes Absolute: 0.7 10*3/uL (ref 0.1–1.0)
Monocytes Relative: 8 %
NEUTROS ABS: 4.5 10*3/uL (ref 1.7–7.7)
Neutrophils Relative %: 51 %
PLATELETS: 386 10*3/uL (ref 150–400)
RBC: 4.22 MIL/uL (ref 3.87–5.11)
RDW: 16.5 % — ABNORMAL HIGH (ref 11.5–15.5)
WBC: 9 10*3/uL (ref 4.0–10.5)

## 2018-07-13 LAB — URINALYSIS, MICROSCOPIC (REFLEX): RBC / HPF: >50 RBC/hpf (ref 0–5)

## 2018-07-13 LAB — PREGNANCY, URINE: PREG TEST UR: NEGATIVE

## 2018-07-13 NOTE — ED Notes (Signed)
Called Dr. Juliene PinaMody @ [facility]

## 2018-07-13 NOTE — ED Triage Notes (Signed)
Abdominal pain and vaginal bleeding. She has a hx of fibroids.

## 2018-07-13 NOTE — ED Provider Notes (Signed)
[facility] EMERGENCY DEPARTMENT Provider Note   CSN: [id number] Arrival date & time: 07/13/18  1557     History   Chief Complaint Chief Complaint  Patient presents with  . Abdominal Pain  . Vaginal Bleeding    HPI Pamela Ewing is a 42 y.o. female.  She is complaining of dysfunctional uterine bleeding that is been going on since last year.  Over the last for 5 months it has been getting progressively worse where she is bleeding normally on the first through the 10th of the month but then continues to bleed for 2 more weeks.  She states she is seeing her gynecologist multiple times for this and is currently on ibuprofen or Naprosyn and is using norethindrone which has been increased to 40 a day.  She still continues to past quarter and half dollar size clots and is using multiple pads a day.  She states she is feeling tired but does not endorse any chest pain shortness of breath dizziness or syncope.  She is frustrated with with his been going and came to the emergency department to see if there is anything else she can do.  The history is provided by the patient.  Abdominal Pain   Episode onset: months. The problem occurs constantly. The problem has not changed since onset.The pain is located in the suprapubic region. The pain is moderate. Pertinent negatives include fever, diarrhea, nausea, vomiting and dysuria.  Vaginal Bleeding  Primary symptoms include vaginal bleeding.  Primary symptoms include no dysuria. There has been no fever. Episode onset: months. The problem occurs constantly. The problem has not changed since onset.She is not pregnant. Her LMP is unknown. Associated symptoms include abdominal pain and light-headedness. Pertinent negatives include no diarrhea, no nausea and no vomiting.    Past Medical History:  Diagnosis Date  . Hypertension     There are no active problems to display for this patient.   Past Surgical History:  Procedure Laterality Date   . BREAST EXCISIONAL BIOPSY Left   . CHOLECYSTECTOMY    . lymphedema Left   . TONSILLECTOMY    . TUBAL LIGATION       OB History   None      Home Medications    Prior to Admission medications   Medication Sig Start Date End Date Taking? Authorizing Provider  amLODipine (NORVASC) 5 MG tablet Take 1 tablet (5 mg total) by mouth daily. Reported on 05/31/2016 05/31/16   Linwood Dibbles, MD  diclofenac (VOLTAREN) 75 MG EC tablet Take 1 tablet (75 mg total) by mouth 2 (two) times daily. 03/30/15   Ozella Rocks, MD  hydrochlorothiazide (HYDRODIURIL) 25 MG tablet Take 1 tablet (25 mg total) by mouth at bedtime. 05/31/16   Linwood Dibbles, MD  ibuprofen (ADVIL,MOTRIN) 200 MG tablet Take 400-800 mg by mouth every 6 (six) hours as needed for moderate pain.     [provider]  ibuprofen (ADVIL,MOTRIN) 800 MG tablet Take 800-1,600 mg by mouth every 8 (eight) hours as needed for moderate pain.  07/10/14   Kirichenko, Lemont Fillers, PA-C  meloxicam (MOBIC) 15 MG tablet Take 0.5-1 tablets (7.5-15 mg total) by mouth daily. 03/15/16   Ozella Rocks, MD  methocarbamol (ROBAXIN) 500 MG tablet Take 1-2 tablets (500-1,000 mg total) by mouth every 6 (six) hours as needed for muscle spasms. 03/15/16   Ozella Rocks, MD  naproxen (NAPROSYN) 500 MG tablet Take 1 tablet (500 mg total) by mouth 2 (two) times daily with a  meal. As needed for pain 05/31/16   Linwood Dibbles, MD  pantoprazole (PROTONIX) 40 MG tablet Take 1 tablet (40 mg total) by mouth daily. Patient taking differently: Take 40 mg by mouth daily as needed (for acid reflux).  03/15/16   Ozella Rocks, MD  traMADol (ULTRAM) 50 MG tablet Take 1 tablet (50 mg total) by mouth every 6 (six) hours as needed. 03/30/15   Ozella Rocks, MD    Family History Family History  Problem Relation Age of Onset  . Cancer Mother        breast    Social History Social History   Tobacco Use  . Smoking status: Never Smoker  . Smokeless tobacco: Never Used    Substance Use Topics  . Alcohol use: No  . Drug use: No     Allergies   Azithromycin; Lisinopril; and Sulfa antibiotics   Review of Systems Review of Systems  Constitutional: Positive for fatigue. Negative for fever.  HENT: Negative for sore throat.   Eyes: Negative for visual disturbance.  Respiratory: Negative for shortness of breath.   Cardiovascular: Negative for chest pain.  Gastrointestinal: Positive for abdominal pain. Negative for diarrhea, nausea and vomiting.  Genitourinary: Positive for vaginal bleeding. Negative for dysuria.  Musculoskeletal: Negative for neck pain.  Skin: Negative for rash.  Neurological: Positive for light-headedness. Negative for syncope.     Physical Exam Updated Vital Signs BP 107/72   Pulse 71   Temp 98.1 F (36.7 C) (Oral)   Resp 20   Ht 5\' 5"  (1.651 m)   Wt 120.2 kg (265 lb)   SpO2 100%   BMI 44.10 kg/m   Physical Exam  Constitutional: She appears well-developed and well-nourished.  HENT:  Head: Normocephalic and atraumatic.  Eyes: Conjunctivae are normal.  Neck: Neck supple.  Cardiovascular: Normal rate and normal heart sounds.  No murmur heard. Pulmonary/Chest: Effort normal. No stridor. She has no rhonchi. She has no rales.  Abdominal: Soft. Normal appearance and bowel sounds are normal. There is tenderness in the suprapubic area. There is no rigidity and no guarding.  Musculoskeletal: Normal range of motion. She exhibits no tenderness.  Neurological: She is alert. GCS eye subscore is 4. GCS verbal subscore is 5. GCS motor subscore is 6.  Skin: Skin is warm and dry.  Psychiatric: She has a normal mood and affect.     ED Treatments / Results  Labs (all labs ordered are listed, but only abnormal results are displayed) Labs Reviewed  URINALYSIS, ROUTINE W REFLEX MICROSCOPIC - Abnormal; Notable for the following components:      Result Value   Color, Urine RED (*)    APPearance TURBID (*)    Hgb urine dipstick LARGE  (*)    Bilirubin Urine SMALL (*)    Ketones, ur 15 (*)    Protein, ur 100 (*)    Leukocytes, UA TRACE (*)    All other components within normal limits  URINALYSIS, MICROSCOPIC (REFLEX) - Abnormal; Notable for the following components:   Bacteria, UA FEW (*)    All other components within normal limits  BASIC METABOLIC PANEL - Abnormal; Notable for the following components:   Glucose, Bld 121 (*)    BUN 21 (*)    Creatinine, Ser 1.44 (*)    Calcium 8.8 (*)    GFR calc non Af Amer 44 (*)    GFR calc Af Amer 51 (*)    All other components within normal limits  CBC WITH  DIFFERENTIAL/PLATELET - Abnormal; Notable for the following components:   Hemoglobin 11.3 (*)    HCT 34.4 (*)    RDW 16.5 (*)    All other components within normal limits  PREGNANCY, URINE    EKG None  Radiology No results found.  Procedures Procedures (including critical care time)  Medications Ordered in ED Medications - No data to display   Initial Impression / Assessment and Plan / ED Course  I have reviewed the triage vital signs and the nursing notes.  Pertinent labs & imaging results that were available during my care of the patient were reviewed by me and considered in my medical decision making (see chart for details).  Clinical Course as of Jul 14 2327  Mon Jul 13, 2018  1659  placed a call into Dr. Juliene PinaMody the patient's OB/GYN.   [MB]  1659 Patient's labs are significant for hemoglobin of 11.3 which is about her baseline.-At some significantly up as a creatinine of 1.44 of which we have values from 2 years ago that were normal.   [MB]  1737 Gust with Dr. Tildon HuskyModi the patient's GYN.  She recommended he discharge her and she will have her follow-up in the office within the next couple of days.   [MB]  1738 Reviewed her labs with the patient.  She was aware that her creatinine is been elevated but was quite here that she is not more anemic.  She understands the need to follow-up with her PCP regarding  blood pressure and her creatinine but also follow-up with her GYN regarding her bleeding.   [MB]    Clinical Course User Index [MB] Terrilee FilesButler, Michael C, MD     Final Clinical Impressions(s) / ED Diagnoses   Final diagnoses:  Dysfunctional uterine bleeding    ED Discharge Orders    None       Terrilee FilesButler, Michael C, MD 07/13/18 2328

## 2018-07-13 NOTE — ED Notes (Signed)
Called Dr. Juliene PinaMody answering service spoke with Gabriel RungJoe

## 2018-07-13 NOTE — ED Notes (Signed)
Pt. Reports she  Is having pain also in the lower abd. And vaginal area

## 2018-07-13 NOTE — Discharge Instructions (Signed)
Your evaluated in the emergency department for continued vaginal bleeding.  You blood work showed that you are not more anemic than your baseline.  Your creatinine was slightly elevated and that will need to be followed up with your primary care doctor.  Dr. Tildon HuskyModi from gynecology was contacted and she states the office will call you for close follow-up within the next few days.  Please continue your regular medications.

## 2018-07-21 ENCOUNTER — Inpatient Hospital Stay
Admission: RE | Admit: 2018-07-21 | Discharge: 2018-07-21 | Disposition: A | Discharge status: Home / Self Care | Payer: 59 | Source: Ambulatory Visit | Attending: Physician Assistant | Admitting: Physician Assistant

## 2018-08-28 ENCOUNTER — Inpatient Hospital Stay: Admission: RE | Admit: 2018-08-28 | Payer: 59 | Source: Ambulatory Visit

## 2018-09-22 ENCOUNTER — Inpatient Hospital Stay: Admission: RE | Admit: 2018-09-22 | Payer: Medicaid Other | Source: Ambulatory Visit

## 2019-02-04 ENCOUNTER — Other Ambulatory Visit: Payer: Self-pay | Admitting: Obstetrics & Gynecology

## 2019-02-04 DIAGNOSIS — R928 Other abnormal and inconclusive findings on diagnostic imaging of breast: Secondary | ICD-10-CM

## 2019-02-19 ENCOUNTER — Ambulatory Visit
Admission: RE | Admit: 2019-02-19 | Discharge: 2019-02-19 | Disposition: A | Discharge status: Home / Self Care | Payer: BLUE CROSS/BLUE SHIELD | Source: Ambulatory Visit | Attending: Obstetrics & Gynecology | Admitting: Obstetrics & Gynecology

## 2019-02-19 ENCOUNTER — Other Ambulatory Visit: Payer: Self-pay | Admitting: Obstetrics & Gynecology

## 2019-02-19 DIAGNOSIS — R928 Other abnormal and inconclusive findings on diagnostic imaging of breast: Secondary | ICD-10-CM

## 2019-02-19 DIAGNOSIS — R599 Enlarged lymph nodes, unspecified: Secondary | ICD-10-CM

## 2019-02-25 ENCOUNTER — Other Ambulatory Visit: Payer: Self-pay

## 2019-02-25 ENCOUNTER — Ambulatory Visit
Admission: RE | Admit: 2019-02-25 | Discharge: 2019-02-25 | Disposition: A | Discharge status: Home / Self Care | Payer: BLUE CROSS/BLUE SHIELD | Source: Ambulatory Visit | Attending: Obstetrics & Gynecology | Admitting: Obstetrics & Gynecology

## 2019-02-25 DIAGNOSIS — R599 Enlarged lymph nodes, unspecified: Secondary | ICD-10-CM

## 2019-02-26 ENCOUNTER — Other Ambulatory Visit (HOSPITAL_COMMUNITY)
Admission: RE | Admit: 2019-02-26 | Discharge: 2019-02-26 | Disposition: A | Discharge status: Home / Self Care | Payer: BLUE CROSS/BLUE SHIELD | Source: Ambulatory Visit | Attending: Diagnostic Radiology | Admitting: Diagnostic Radiology

## 2019-08-02 IMAGING — MG DIGITAL SCREENING BILATERAL MAMMOGRAM WITH TOMO AND CAD
4 series · 4 of 4 positions shown · non-contrast
Comparison: No prior mammograms.

CLINICAL DATA: Screening. This is the patient's initial baseline
mammogram.

EXAM:
DIGITAL SCREENING BILATERAL MAMMOGRAM WITH TOMO AND CAD

[R CC synth-2D]
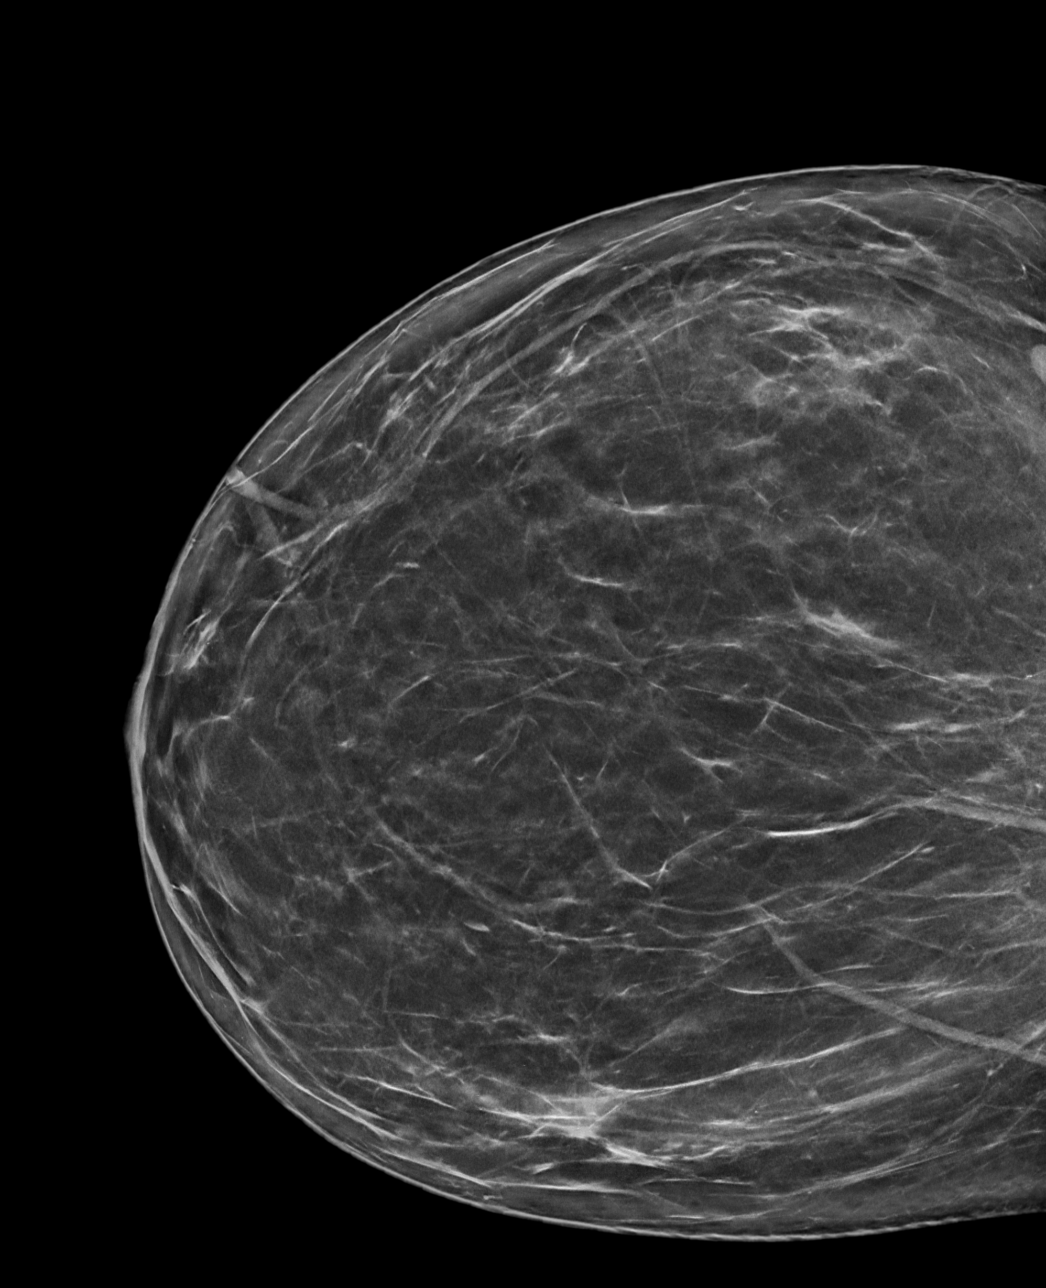

[R MLO synth-2D]
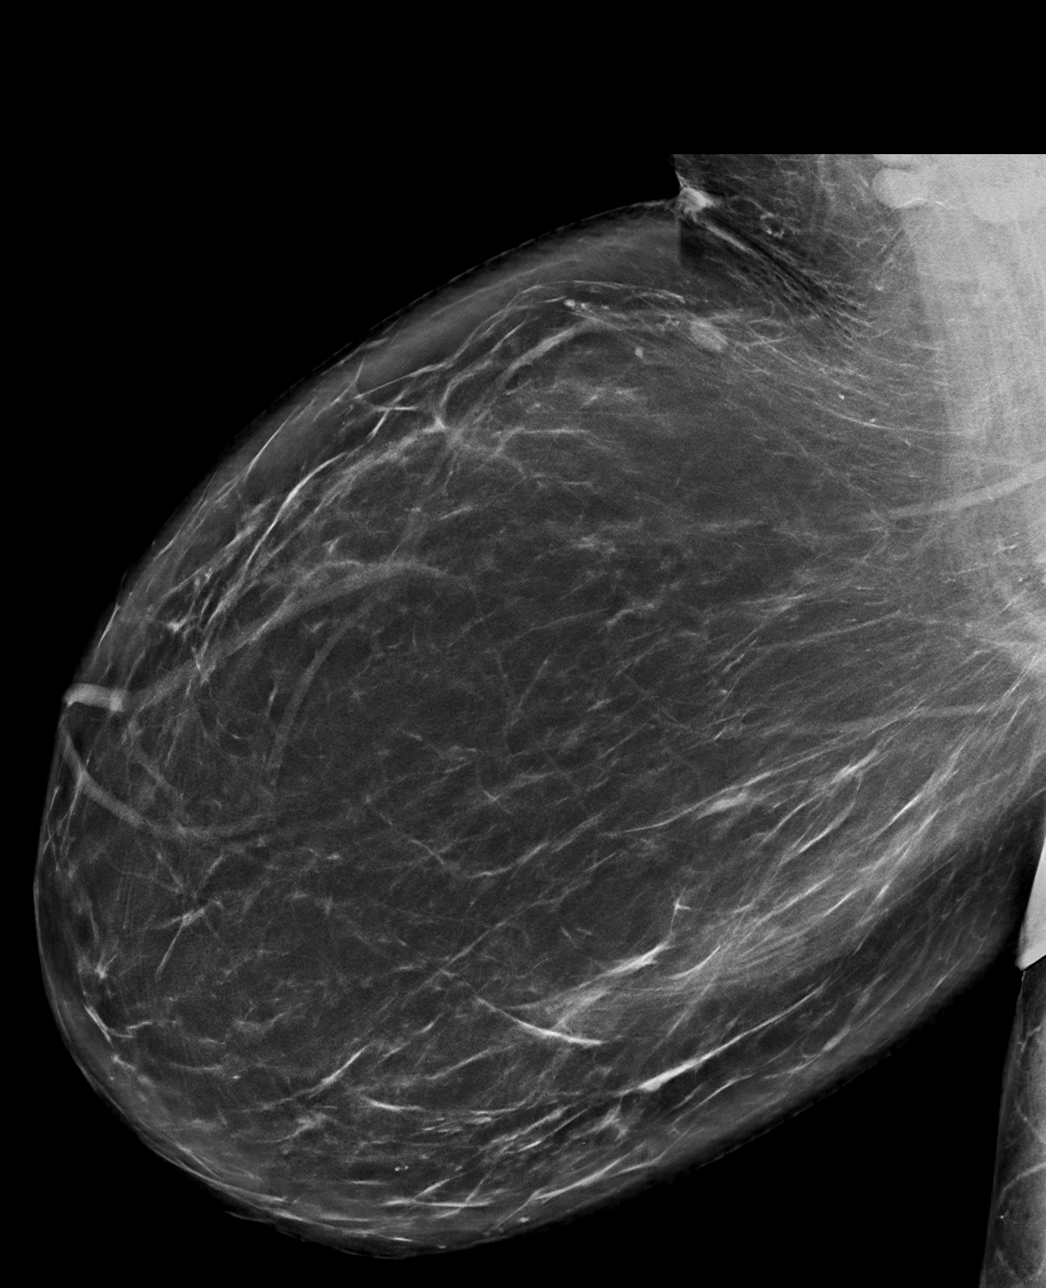

[L CC synth-2D]
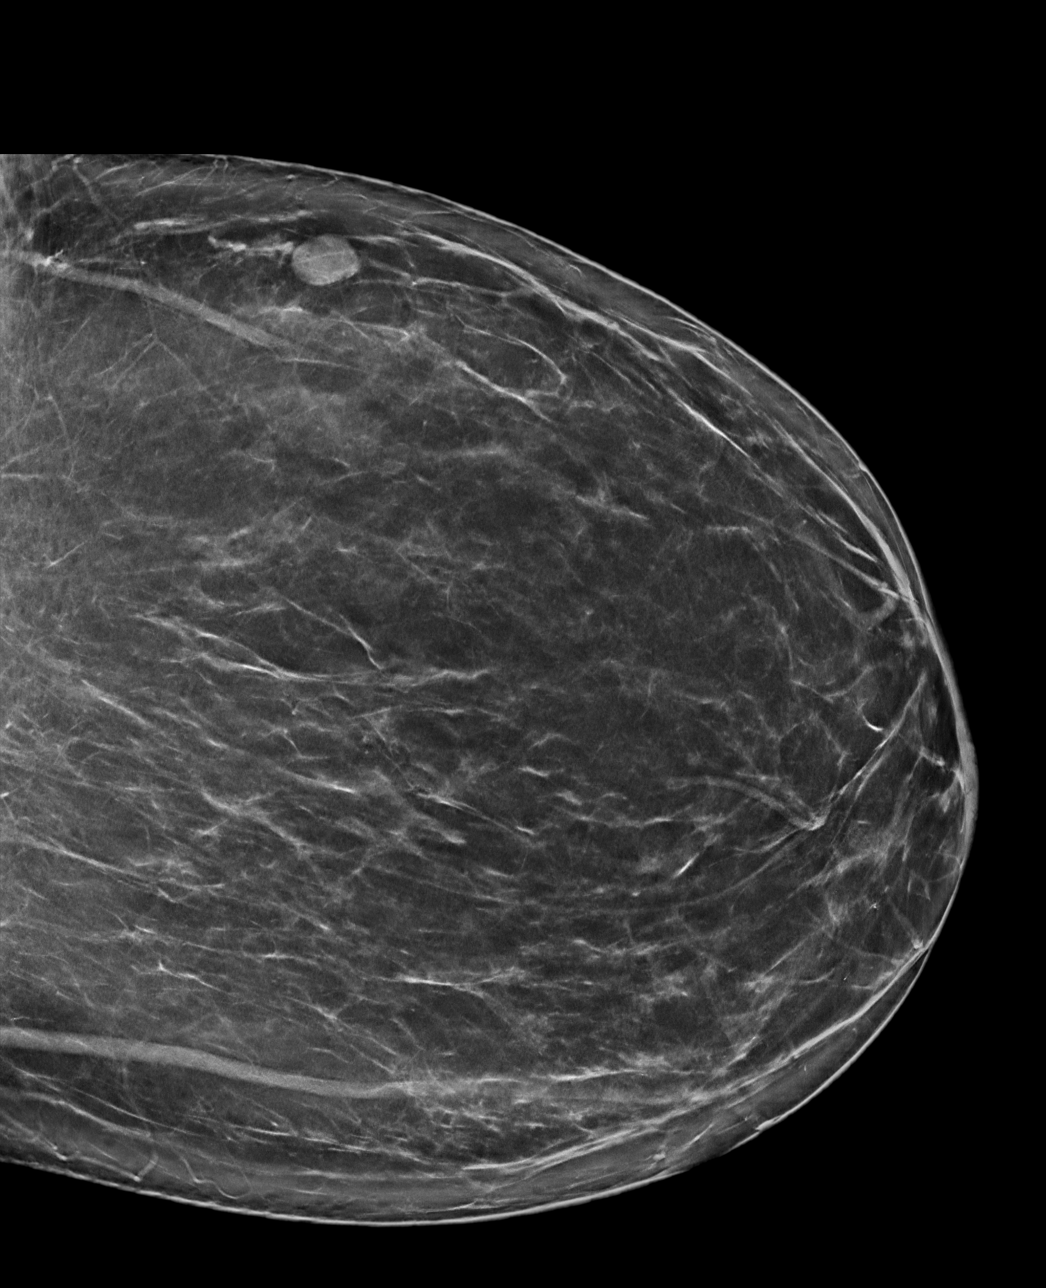

[L MLO synth-2D]
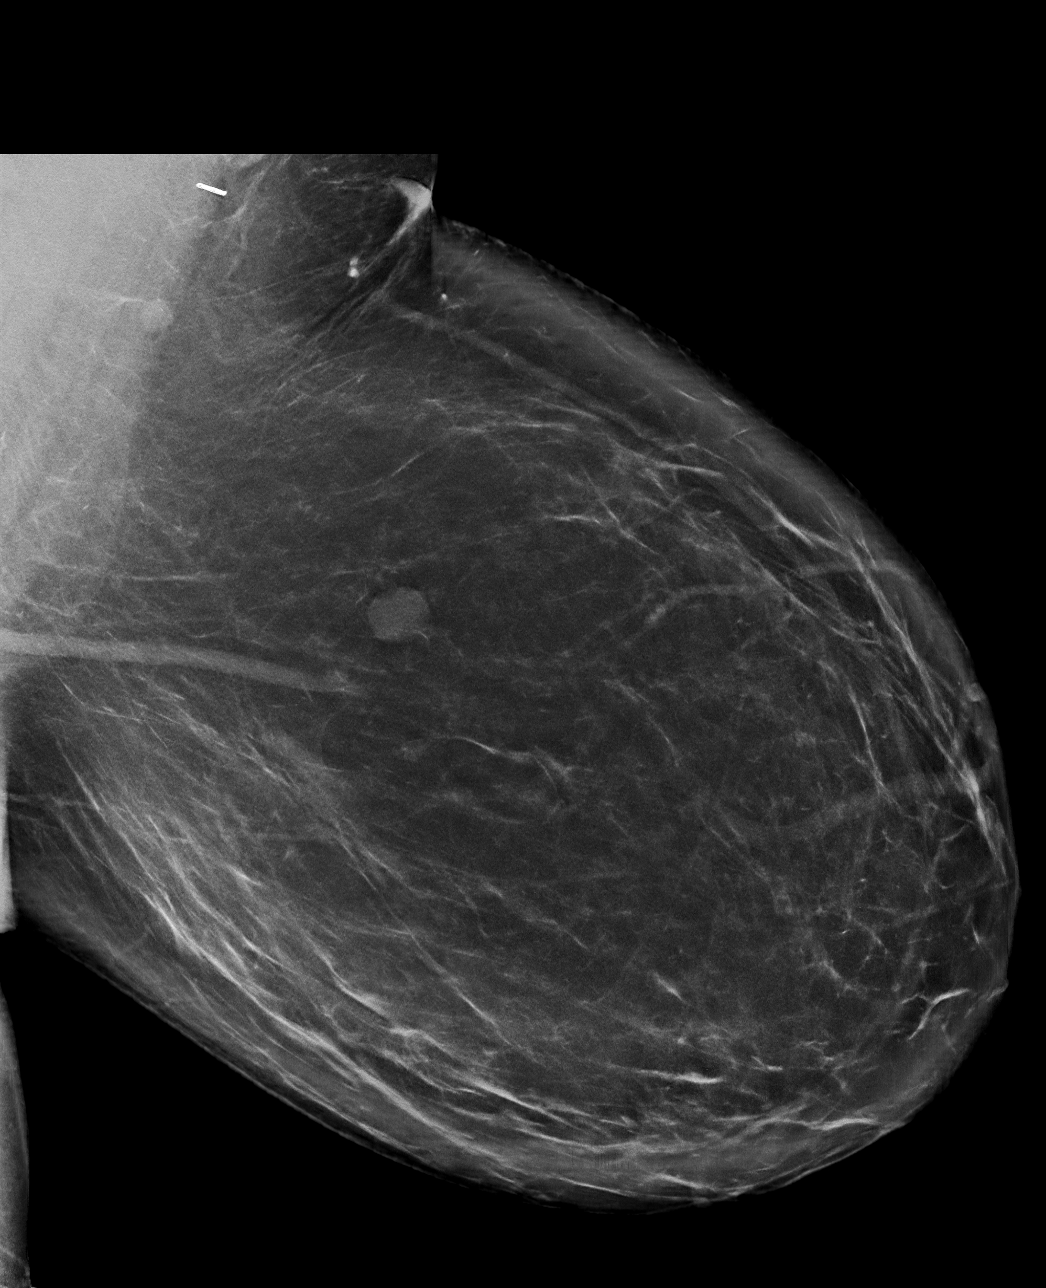

[4 of 4 positions shown; findings below may reference images not displayed]

Report of prior LEFT breast
ultrasound 07/08/2012 from [place] which was interpreted as negative.

ACR Breast Density Category b: There are scattered areas of
fibroglandular density.
FINDINGS: In the left breast, a possible mass warrants further evaluation. In
the right breast, no findings suspicious for malignancy. Images were
processed with CAD.
IMPRESSION: Further evaluation is suggested for possible mass in the left
breast.

RECOMMENDATION:
Ultrasound of the left breast. (Code:[id number])

The patient will be contacted regarding the findings, and additional
imaging will be scheduled.

BI-RADS CATEGORY  0: Incomplete. Need additional imaging evaluation
and/or prior mammograms for comparison.

## 2019-11-23 ENCOUNTER — Other Ambulatory Visit: Payer: Self-pay | Admitting: Physician Assistant

## 2019-11-23 DIAGNOSIS — Z1231 Encounter for screening mammogram for malignant neoplasm of breast: Secondary | ICD-10-CM

## 2019-12-15 ENCOUNTER — Ambulatory Visit: Payer: BLUE CROSS/BLUE SHIELD

## 2020-01-24 ENCOUNTER — Ambulatory Visit: Payer: BC Managed Care – PPO

## 2020-02-09 ENCOUNTER — Other Ambulatory Visit: Payer: Self-pay | Admitting: Physician Assistant

## 2020-02-09 DIAGNOSIS — N644 Mastodynia: Secondary | ICD-10-CM

## 2020-02-17 ENCOUNTER — Ambulatory Visit
Admission: RE | Admit: 2020-02-17 | Discharge: 2020-02-17 | Disposition: A | Discharge status: Home / Self Care | Payer: BC Managed Care – PPO | Source: Ambulatory Visit | Attending: Physician Assistant | Admitting: Physician Assistant

## 2020-02-17 ENCOUNTER — Other Ambulatory Visit: Payer: Self-pay

## 2020-02-17 ENCOUNTER — Ambulatory Visit: Payer: BC Managed Care – PPO

## 2020-02-17 DIAGNOSIS — N644 Mastodynia: Secondary | ICD-10-CM

## 2020-02-22 ENCOUNTER — Other Ambulatory Visit: Payer: BC Managed Care – PPO

## 2020-03-01 ENCOUNTER — Ambulatory Visit: Payer: BC Managed Care – PPO

## 2021-01-19 ENCOUNTER — Other Ambulatory Visit: Payer: Self-pay | Admitting: Physician Assistant

## 2021-01-19 DIAGNOSIS — Z1231 Encounter for screening mammogram for malignant neoplasm of breast: Secondary | ICD-10-CM

## 2021-03-07 ENCOUNTER — Inpatient Hospital Stay: Admission: RE | Admit: 2021-03-07 | Payer: BC Managed Care – PPO | Source: Ambulatory Visit

## 2021-03-28 ENCOUNTER — Inpatient Hospital Stay: Admission: RE | Admit: 2021-03-28 | Payer: BC Managed Care – PPO | Source: Ambulatory Visit

## 2021-04-05 ENCOUNTER — Inpatient Hospital Stay: Admission: RE | Admit: 2021-04-05 | Payer: BC Managed Care – PPO | Source: Ambulatory Visit

## 2021-06-12 ENCOUNTER — Inpatient Hospital Stay: Admission: RE | Admit: 2021-06-12 | Payer: BC Managed Care – PPO | Source: Ambulatory Visit

## 2021-08-02 ENCOUNTER — Ambulatory Visit: Payer: BC Managed Care – PPO

## 2021-09-19 ENCOUNTER — Ambulatory Visit: Payer: BC Managed Care – PPO

## 2022-02-21 ENCOUNTER — Other Ambulatory Visit: Payer: Self-pay | Admitting: Physician Assistant

## 2022-02-21 DIAGNOSIS — Z1231 Encounter for screening mammogram for malignant neoplasm of breast: Secondary | ICD-10-CM

## 2022-03-13 ENCOUNTER — Ambulatory Visit
Admission: RE | Admit: 2022-03-13 | Discharge: 2022-03-13 | Disposition: A | Discharge status: Home / Self Care | Payer: BC Managed Care – PPO | Source: Ambulatory Visit | Attending: Physician Assistant | Admitting: Physician Assistant

## 2022-03-13 DIAGNOSIS — Z1231 Encounter for screening mammogram for malignant neoplasm of breast: Secondary | ICD-10-CM

## 2022-03-14 ENCOUNTER — Other Ambulatory Visit: Payer: Self-pay | Admitting: Physician Assistant

## 2022-03-14 ENCOUNTER — Ambulatory Visit: Payer: BC Managed Care – PPO

## 2022-03-14 DIAGNOSIS — R928 Other abnormal and inconclusive findings on diagnostic imaging of breast: Secondary | ICD-10-CM

## 2022-04-01 ENCOUNTER — Other Ambulatory Visit: Payer: BC Managed Care – PPO

## 2022-04-02 ENCOUNTER — Other Ambulatory Visit: Payer: BC Managed Care – PPO

## 2022-04-08 ENCOUNTER — Other Ambulatory Visit: Payer: Self-pay | Admitting: Physician Assistant

## 2022-04-08 ENCOUNTER — Ambulatory Visit
Admission: RE | Admit: 2022-04-08 | Discharge: 2022-04-08 | Disposition: A | Discharge status: Home / Self Care | Payer: BC Managed Care – PPO | Source: Ambulatory Visit | Attending: Physician Assistant | Admitting: Physician Assistant

## 2022-04-08 DIAGNOSIS — R928 Other abnormal and inconclusive findings on diagnostic imaging of breast: Secondary | ICD-10-CM

## 2022-04-17 ENCOUNTER — Other Ambulatory Visit: Payer: BC Managed Care – PPO

## 2022-07-24 ENCOUNTER — Encounter (HOSPITAL_BASED_OUTPATIENT_CLINIC_OR_DEPARTMENT_OTHER): Payer: Self-pay

## 2022-07-24 ENCOUNTER — Emergency Department (HOSPITAL_BASED_OUTPATIENT_CLINIC_OR_DEPARTMENT_OTHER)
Admission: EM | Admit: 2022-07-24 | Discharge: 2022-07-25 | Discharge status: Against Medical Advice | Payer: BC Managed Care – PPO | Attending: Emergency Medicine | Admitting: Emergency Medicine

## 2022-07-24 ENCOUNTER — Other Ambulatory Visit: Payer: Self-pay

## 2022-07-24 DIAGNOSIS — R42 Dizziness and giddiness: Secondary | ICD-10-CM | POA: Insufficient documentation

## 2022-07-24 DIAGNOSIS — M549 Dorsalgia, unspecified: Secondary | ICD-10-CM | POA: Insufficient documentation

## 2022-07-24 DIAGNOSIS — Y92410 Unspecified street and highway as the place of occurrence of the external cause: Secondary | ICD-10-CM | POA: Insufficient documentation

## 2022-07-24 DIAGNOSIS — Z5321 Procedure and treatment not carried out due to patient leaving prior to being seen by health care provider: Secondary | ICD-10-CM | POA: Insufficient documentation

## 2022-07-24 HISTORY — DX: Type 2 diabetes mellitus without complications: E11.9
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
## 2022-12-12 ENCOUNTER — Other Ambulatory Visit: Payer: Self-pay | Admitting: Physician Assistant

## 2022-12-12 ENCOUNTER — Ambulatory Visit
Admission: RE | Admit: 2022-12-12 | Discharge: 2022-12-12 | Disposition: A | Discharge status: Home / Self Care | Payer: BC Managed Care – PPO | Source: Ambulatory Visit | Attending: Physician Assistant | Admitting: Physician Assistant

## 2022-12-12 DIAGNOSIS — R928 Other abnormal and inconclusive findings on diagnostic imaging of breast: Secondary | ICD-10-CM

## 2023-10-29 ENCOUNTER — Ambulatory Visit
Admission: RE | Admit: 2023-10-29 | Discharge: 2023-10-29 | Disposition: A | Discharge status: Home / Self Care | Payer: BC Managed Care – PPO | Source: Ambulatory Visit | Attending: Physician Assistant | Admitting: Physician Assistant

## 2023-10-29 DIAGNOSIS — R928 Other abnormal and inconclusive findings on diagnostic imaging of breast: Secondary | ICD-10-CM

## 2024-10-27 ENCOUNTER — Other Ambulatory Visit: Payer: Self-pay | Admitting: Physician Assistant

## 2024-10-27 DIAGNOSIS — Z1231 Encounter for screening mammogram for malignant neoplasm of breast: Secondary | ICD-10-CM

## 2024-11-18 ENCOUNTER — Other Ambulatory Visit: Payer: Self-pay | Admitting: Medical Genetics

## 2024-11-25 ENCOUNTER — Ambulatory Visit

## 2024-12-23 ENCOUNTER — Ambulatory Visit

## 2024-12-25 ENCOUNTER — Encounter (HOSPITAL_BASED_OUTPATIENT_CLINIC_OR_DEPARTMENT_OTHER): Payer: Self-pay | Admitting: Emergency Medicine

## 2024-12-25 ENCOUNTER — Emergency Department (HOSPITAL_BASED_OUTPATIENT_CLINIC_OR_DEPARTMENT_OTHER)
Admission: EM | Admit: 2024-12-25 | Discharge: 2024-12-25 | Disposition: A | Discharge status: Home / Self Care | Attending: Emergency Medicine | Admitting: Emergency Medicine

## 2024-12-25 ENCOUNTER — Other Ambulatory Visit: Payer: Self-pay

## 2024-12-25 DIAGNOSIS — M5432 Sciatica, left side: Secondary | ICD-10-CM

## 2024-12-25 DIAGNOSIS — M79605 Pain in left leg: Secondary | ICD-10-CM | POA: Diagnosis not present

## 2024-12-25 DIAGNOSIS — M25552 Pain in left hip: Secondary | ICD-10-CM | POA: Diagnosis present

## 2024-12-25 MED ORDER — PREDNISONE 10 MG PO TABS: 20.0000 mg | ORAL_TABLET | Freq: Two times a day (BID) | ORAL | 0 refills | Status: DC

## 2024-12-25 MED ORDER — PREDNISONE 20 MG PO TABS
40.0000 mg | ORAL_TABLET | Freq: Once | ORAL | Status: AC
  Administered 2024-12-25: 40 mg via ORAL
  Filled 2024-12-25: qty 2

## 2024-12-25 MED ORDER — OXYCODONE-ACETAMINOPHEN 5-325 MG PO TABS
2.0000 | ORAL_TABLET | Freq: Once | ORAL | Status: AC
  Administered 2024-12-25: 2 via ORAL
  Filled 2024-12-25: qty 2

## 2024-12-25 MED ORDER — OXYCODONE-ACETAMINOPHEN 5-325 MG PO TABS: 1.0000 | ORAL_TABLET | Freq: Four times a day (QID) | ORAL | 0 refills | Status: DC | PRN

## 2024-12-25 NOTE — ED Provider Notes (Signed)
 " [place] [facility] AT [facility] Provider Note   CSN: [id number] Arrival date & time: 12/25/24  2232     Patient presents with: Leg Pain   Pamela Ewing is a 49 y.o. female.   Patient is a 49 year old female presenting with complaints of left hip and leg pain.  Symptoms have been worsening over the past few days.  They have been in the absence of any significant injury or trauma.  She does have a history of osteoarthritis of the left knee and describes it as bone-on-bone.  She denies any bowel or bladder complaints.  No weakness or numbness.       Prior to Admission medications  Medication Sig Start Date End Date Taking? Authorizing Provider  amLODipine  (NORVASC ) 5 MG tablet Take 1 tablet (5 mg total) by mouth daily. Reported on 05/31/2016 05/31/16   Randol Simmonds, MD  diclofenac  (VOLTAREN ) 75 MG EC tablet Take 1 tablet (75 mg total) by mouth 2 (two) times daily. 03/30/15   Remonia Alm PARAS, MD  hydrochlorothiazide  (HYDRODIURIL ) 25 MG tablet Take 1 tablet (25 mg total) by mouth at bedtime. 05/31/16   Randol Simmonds, MD  ibuprofen  (ADVIL ,MOTRIN ) 200 MG tablet Take 400-800 mg by mouth every 6 (six) hours as needed for moderate pain.     [provider]  ibuprofen  (ADVIL ,MOTRIN ) 800 MG tablet Take 800-1,600 mg by mouth every 8 (eight) hours as needed for moderate pain.  07/10/14   Kirichenko, Tatyana, PA-C  meloxicam  (MOBIC ) 15 MG tablet Take 0.5-1 tablets (7.5-15 mg total) by mouth daily. 03/15/16   Remonia Alm PARAS, MD  methocarbamol  (ROBAXIN ) 500 MG tablet Take 1-2 tablets (500-1,000 mg total) by mouth every 6 (six) hours as needed for muscle spasms. 03/15/16   Remonia Alm PARAS, MD  naproxen  (NAPROSYN ) 500 MG tablet Take 1 tablet (500 mg total) by mouth 2 (two) times daily with a meal. As needed for pain 05/31/16   Randol Simmonds, MD  pantoprazole  (PROTONIX ) 40 MG tablet Take 1 tablet (40 mg total) by mouth daily. Patient taking differently: Take 40 mg by mouth  daily as needed (for acid reflux).  03/15/16   Remonia Alm PARAS, MD  traMADol  (ULTRAM ) 50 MG tablet Take 1 tablet (50 mg total) by mouth every 6 (six) hours as needed. 03/30/15   Remonia Alm PARAS, MD    Allergies: Azithromycin, Lisinopril, and Sulfa antibiotics    Review of Systems  All other systems reviewed and are negative.   Updated Vital Signs BP (!) 161/100   Pulse 100   Temp 99.6 F (37.6 C) (Oral)   Resp 20   Ht 5' 5 (1.651 m)   Wt 121.1 kg   BMI 44.43 kg/m   Physical Exam Vitals and nursing note reviewed.  HENT:     Head: Normocephalic.  Pulmonary:     Effort: Pulmonary effort is normal.  Musculoskeletal:     Comments: The left knee is grossly normal in appearance.  There is no effusion, warmth, or swelling.  There is tenderness noted to the lateral aspect and posterior aspect of the left hip.  She does have pain with range of motion of the hip.  DP pulses are palpable and motor and sensation are intact throughout the entire foot.  Skin:    General: Skin is warm and dry.  Neurological:     Mental Status: She is alert and oriented to person, place, and time.     (all labs ordered are listed,  but only abnormal results are displayed) Labs Reviewed - No data to display  EKG: None  Radiology: No results found.   Procedures   Medications Ordered in the ED  oxyCODONE -acetaminophen  (PERCOCET/ROXICET) 5-325 MG per tablet 2 tablet (has no administration in time range)  predniSONE  (DELTASONE ) tablet 40 mg (has no administration in time range)                                    Medical Decision Making Risk Prescription drug management.   Patient presenting with pain in her left hip and leg most consistent with sciatica.  This could be caused by favoring her leg due to her osteoarthritis from her left knee.  Patient to be treated with prednisone  and pain medication.  She has to follow-up with her orthopedic doctor if not improving in the next week.      Final diagnoses:  None    ED Discharge Orders     None          Geroldine Berg, MD 12/25/24 2331  "

## 2024-12-25 NOTE — ED Triage Notes (Signed)
 L leg pain and decreased ROM. Pt reports bone on bone in L knee and states she thinks it is causing her to walk in a way that is hurting her L leg and hip. Pt is ambulatory to treatment room. She reports this pain has been there all week. Takes pregabalin, celebrex, tizanidine and tylenol  for her pain. Sees a pain specialist.

## 2024-12-25 NOTE — Discharge Instructions (Addendum)
 Begin taking prednisone  as prescribed.  Begin taking Percocet as prescribed as needed for pain.  Rest.  Apply heating pad for comfort.  Follow-up with your orthopedist if not improving in the next week.

## 2024-12-26 ENCOUNTER — Telehealth (HOSPITAL_BASED_OUTPATIENT_CLINIC_OR_DEPARTMENT_OTHER): Payer: Self-pay | Admitting: Emergency Medicine

## 2024-12-26 ENCOUNTER — Telehealth (HOSPITAL_BASED_OUTPATIENT_CLINIC_OR_DEPARTMENT_OTHER): Payer: Self-pay

## 2024-12-26 MED ORDER — PREDNISONE 10 MG PO TABS: 20.0000 mg | ORAL_TABLET | Freq: Two times a day (BID) | ORAL | 0 refills | Status: AC

## 2024-12-26 MED ORDER — OXYCODONE-ACETAMINOPHEN 5-325 MG PO TABS: 1.0000 | ORAL_TABLET | Freq: Four times a day (QID) | ORAL | 0 refills | Status: AC | PRN

## 2024-12-26 NOTE — Telephone Encounter (Cosign Needed)
 10:16 AM Asked by RN to transfer prednisone  prescription from last night to a different pharmacy.  Completed.

## 2024-12-26 NOTE — Telephone Encounter (Signed)
 Patient called, prescription sent to the wrong pharmacy.  Sent to the correct pharmacy.

## 2025-01-06 ENCOUNTER — Other Ambulatory Visit: Payer: Self-pay | Admitting: Physician Assistant

## 2025-01-06 ENCOUNTER — Ambulatory Visit

## 2025-01-06 DIAGNOSIS — N63 Unspecified lump in unspecified breast: Secondary | ICD-10-CM

## 2025-01-24 ENCOUNTER — Other Ambulatory Visit (HOSPITAL_COMMUNITY)

## 2025-01-25 ENCOUNTER — Encounter

## 2025-02-02 ENCOUNTER — Other Ambulatory Visit (HOSPITAL_COMMUNITY)

## 2025-02-03 ENCOUNTER — Encounter
